# Patient Record
Sex: Female | Born: 1990 | Race: Black or African American | Hispanic: No | Marital: Single | Smoking: Never smoker
Health system: Southern US, Community
[De-identification: ages and names within clinical notes are randomized; demographics above are authoritative.]

## PROBLEM LIST (undated history)

## (undated) ENCOUNTER — Inpatient Hospital Stay (HOSPITAL_COMMUNITY): Payer: Self-pay

## (undated) DIAGNOSIS — O0930 Supervision of pregnancy with insufficient antenatal care, unspecified trimester: Secondary | ICD-10-CM

## (undated) DIAGNOSIS — R51 Headache: Secondary | ICD-10-CM

## (undated) HISTORY — PX: TONSILLECTOMY: SUR1361

## (undated) HISTORY — DX: Supervision of pregnancy with insufficient antenatal care, unspecified trimester: O09.30

---
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
## 2008-05-20 ENCOUNTER — Emergency Department (HOSPITAL_COMMUNITY): Admission: EM | Admit: 2008-05-20 | Discharge: 2008-05-20 | Payer: Self-pay | Admitting: Emergency Medicine

## 2010-12-12 NOTE — L&D Delivery Note (Signed)
Delivery Note At 10:21 AM a viable and healthy female was delivered via  ROA, with nuchal arm.  APGAR 9,9; weight 6 lb 12.8 oz (3084 g).   Placenta schultze intact, spontaneous 3 vessel cord. 2nd degree perineal laceration with repair 3-0 monocryl for good hemostasis. Epidural for labor and delivery. Pushed x 6 contractions. EBL 400 m. IV pitocin 20 u. Stable. Collaboration with Dr. Stefano Gaul. Plans outpatient circumcision. Bottle feeding. Delivery by Lavera Guise, CNM  Anesthesia:  epidural Episiotomy:  Lacerations: 2nd degree perineal Suture Repair: 3.0 monocryl Est. Blood Loss (mL):   Mom to postpartum.  Baby to skin to skin.  Nigel Bridgeman 11/25/2011, 10:53 AM

## 2011-03-08 ENCOUNTER — Other Ambulatory Visit: Payer: Self-pay | Admitting: Family Medicine

## 2011-03-08 ENCOUNTER — Other Ambulatory Visit (HOSPITAL_COMMUNITY)
Admission: RE | Admit: 2011-03-08 | Discharge: 2011-03-08 | Disposition: A | Discharge status: Home / Self Care | Payer: 59 | Source: Ambulatory Visit | Attending: Family Medicine | Admitting: Family Medicine

## 2011-03-08 DIAGNOSIS — Z113 Encounter for screening for infections with a predominantly sexual mode of transmission: Secondary | ICD-10-CM | POA: Insufficient documentation

## 2011-03-08 DIAGNOSIS — Z124 Encounter for screening for malignant neoplasm of cervix: Secondary | ICD-10-CM | POA: Insufficient documentation

## 2011-05-03 ENCOUNTER — Inpatient Hospital Stay (HOSPITAL_COMMUNITY)
Admission: AD | Admit: 2011-05-03 | Discharge: 2011-05-04 | Disposition: A | Discharge status: Home / Self Care | Payer: 59 | Source: Ambulatory Visit | Attending: Obstetrics & Gynecology | Admitting: Obstetrics & Gynecology

## 2011-05-03 DIAGNOSIS — O99891 Other specified diseases and conditions complicating pregnancy: Secondary | ICD-10-CM | POA: Insufficient documentation

## 2011-05-03 DIAGNOSIS — R51 Headache: Secondary | ICD-10-CM | POA: Insufficient documentation

## 2011-05-03 LAB — URINE MICROSCOPIC-ADD ON

## 2011-05-03 LAB — URINALYSIS, ROUTINE W REFLEX MICROSCOPIC
Bilirubin Urine: NEGATIVE
Glucose, UA: NEGATIVE mg/dL
Ketones, ur: 15 mg/dL — AB
Nitrite: NEGATIVE
Protein, ur: NEGATIVE mg/dL
Specific Gravity, Urine: 1.025 (ref 1.005–1.030)
Urobilinogen, UA: 0.2 mg/dL (ref 0.0–1.0)
pH: 5.5 (ref 5.0–8.0)

## 2011-05-03 LAB — POCT PREGNANCY, URINE: Preg Test, Ur: POSITIVE

## 2011-05-04 LAB — URINE CULTURE
Colony Count: NO GROWTH
Culture  Setup Time: 201205230152
Culture: NO GROWTH

## 2011-05-13 ENCOUNTER — Inpatient Hospital Stay (HOSPITAL_COMMUNITY): Payer: 59

## 2011-05-13 ENCOUNTER — Inpatient Hospital Stay (HOSPITAL_COMMUNITY)
Admission: AD | Admit: 2011-05-13 | Discharge: 2011-05-13 | Disposition: A | Discharge status: Home / Self Care | Payer: 59 | Source: Ambulatory Visit | Attending: Obstetrics and Gynecology | Admitting: Obstetrics and Gynecology

## 2011-05-13 DIAGNOSIS — O209 Hemorrhage in early pregnancy, unspecified: Secondary | ICD-10-CM

## 2011-05-13 LAB — WET PREP, GENITAL
Trich, Wet Prep: NONE SEEN
Yeast Wet Prep HPF POC: NONE SEEN

## 2011-05-13 LAB — URINALYSIS, ROUTINE W REFLEX MICROSCOPIC
Bilirubin Urine: NEGATIVE
Glucose, UA: NEGATIVE mg/dL
Ketones, ur: NEGATIVE mg/dL
Protein, ur: NEGATIVE mg/dL
pH: 6 (ref 5.0–8.0)

## 2011-05-13 LAB — URINE MICROSCOPIC-ADD ON

## 2011-05-14 LAB — GC/CHLAMYDIA PROBE AMP, GENITAL
Chlamydia, DNA Probe: NEGATIVE
GC Probe Amp, Genital: NEGATIVE

## 2011-06-09 LAB — ANTIBODY SCREEN: Antibody Screen: NEGATIVE

## 2011-06-09 LAB — GC/CHLAMYDIA PROBE AMP, GENITAL
Chlamydia: NEGATIVE
Gonorrhea: NEGATIVE

## 2011-06-30 ENCOUNTER — Other Ambulatory Visit (HOSPITAL_COMMUNITY): Payer: Self-pay | Admitting: Obstetrics and Gynecology

## 2011-06-30 DIAGNOSIS — O2690 Pregnancy related conditions, unspecified, unspecified trimester: Secondary | ICD-10-CM

## 2011-06-30 DIAGNOSIS — Z3689 Encounter for other specified antenatal screening: Secondary | ICD-10-CM

## 2011-07-12 ENCOUNTER — Encounter (HOSPITAL_COMMUNITY): Payer: Self-pay

## 2011-07-12 ENCOUNTER — Ambulatory Visit (HOSPITAL_COMMUNITY)
Admission: RE | Admit: 2011-07-12 | Discharge: 2011-07-12 | Disposition: A | Discharge status: Home / Self Care | Payer: 59 | Source: Ambulatory Visit | Attending: Obstetrics and Gynecology | Admitting: Obstetrics and Gynecology

## 2011-07-12 DIAGNOSIS — O2690 Pregnancy related conditions, unspecified, unspecified trimester: Secondary | ICD-10-CM

## 2011-07-12 DIAGNOSIS — Z1389 Encounter for screening for other disorder: Secondary | ICD-10-CM | POA: Insufficient documentation

## 2011-07-12 DIAGNOSIS — O358XX0 Maternal care for other (suspected) fetal abnormality and damage, not applicable or unspecified: Secondary | ICD-10-CM | POA: Insufficient documentation

## 2011-07-12 DIAGNOSIS — Z3689 Encounter for other specified antenatal screening: Secondary | ICD-10-CM

## 2011-07-12 DIAGNOSIS — Z363 Encounter for antenatal screening for malformations: Secondary | ICD-10-CM | POA: Insufficient documentation

## 2011-09-23 LAB — URINALYSIS, ROUTINE W REFLEX MICROSCOPIC
Bilirubin Urine: NEGATIVE
Ketones, ur: NEGATIVE
Nitrite: NEGATIVE
Protein, ur: NEGATIVE
Urobilinogen, UA: 0.2

## 2011-09-23 LAB — BASIC METABOLIC PANEL
CO2: 23
Calcium: 8.7
Chloride: 105
GFR calc Af Amer: >60
Potassium: 3.4 — ABNORMAL LOW
Sodium: 134 — ABNORMAL LOW

## 2011-09-23 LAB — PREGNANCY, URINE: Preg Test, Ur: NEGATIVE

## 2011-10-23 ENCOUNTER — Inpatient Hospital Stay (HOSPITAL_COMMUNITY)
Admission: AD | Admit: 2011-10-23 | Discharge: 2011-10-23 | Disposition: A | Discharge status: Home / Self Care | Payer: 59 | Source: Ambulatory Visit | Attending: Obstetrics and Gynecology | Admitting: Obstetrics and Gynecology

## 2011-10-23 ENCOUNTER — Encounter (HOSPITAL_COMMUNITY): Payer: Self-pay | Admitting: *Deleted

## 2011-10-23 DIAGNOSIS — O99891 Other specified diseases and conditions complicating pregnancy: Secondary | ICD-10-CM | POA: Insufficient documentation

## 2011-10-23 HISTORY — DX: Headache: R51

## 2011-10-23 LAB — WET PREP, GENITAL
Clue Cells Wet Prep HPF POC: NONE SEEN
Trich, Wet Prep: NONE SEEN

## 2011-10-23 LAB — URINALYSIS, ROUTINE W REFLEX MICROSCOPIC
Glucose, UA: NEGATIVE mg/dL
Specific Gravity, Urine: >1.03 — ABNORMAL HIGH (ref 1.005–1.030)
Urobilinogen, UA: 1 mg/dL (ref 0.0–1.0)
pH: 6 (ref 5.0–8.0)

## 2011-10-23 LAB — URINE MICROSCOPIC-ADD ON

## 2011-10-23 NOTE — Progress Notes (Signed)
Yesterday was on her feet for a long time,  Had bad sharp pain in lower abdominal area two times, today when went to grocery store, had vaginal clear sticky discharge.

## 2011-10-23 NOTE — ED Provider Notes (Signed)
History   20 yo G2P0010 at 63 6/7 weeks presented c/o "mucusy thin discharge".  Denies bleeding, reports occasional cramping, +FM.  Pregnancy remarkable for: Migraines 1st trimester vaginal bleeding Late to care    Chief Complaint  Patient presents with  . Abdominal Pain  . Rupture of Membranes     OB History    Grav Para Term Preterm Abortions TAB SAB Ect Mult Living   2 0 0 0 1 1 0 0 0 0       Past Medical History  Diagnosis Date  . Headache     History reviewed. No pertinent past surgical history.  Family History  Problem Relation Age of Onset  . Hypertension Maternal Aunt   . Cancer Maternal Grandmother   . Diabetes Paternal Grandmother     History  Substance Use Topics  . Smoking status: Current Everyday Smoker    Types: Cigarettes  . Smokeless tobacco: Not on file  . Alcohol Use: No     occasionally    Allergies: No Known Allergies  Prescriptions prior to admission  Medication Sig Dispense Refill  . prenatal vitamin w/FE, FA (PRENATAL 1 + 1) 27-1 MG TABS Take 1 tablet by mouth daily.        Marland Kitchen DISCONTD: PRENATAL VITAMINS PO Take by mouth.           Physical Exam   Blood pressure 118/87, pulse 104, temperature 98.5 F (36.9 C), temperature source Oral, resp. rate 16, height 5\' 2"  (1.575 m), weight 69.4 kg (153 lb), last menstrual period 02/14/2011.  Chest clear Heart RRR without murmur Abd gravid, NT Pelvic no pooling, but moderate creamy vaginal discharge.  Cervix closed, long, vtx -1. Amnisure done--negative. Ferning negative. FHR reactive, no decels UCs--occasional irritability, one defined UC.    ED Course  IUP at 35 6/7 weeks. No evidence SROM or labor GBS done.  D/C'd home with labor precautions. Keep scheduled appointment at [facility].  Nigel Bridgeman, CNM 10/23/11 1800

## 2011-10-23 NOTE — Progress Notes (Signed)
Pt c/o clear "snot" like discharge over the past 30 min while she was walking around the grocery. No bleeding or dysuria.

## 2011-10-27 LAB — CULTURE, BETA STREP (GROUP B ONLY): Organism ID, Bacteria: NEGATIVE

## 2011-10-27 LAB — STREP B DNA PROBE: GBS: NEGATIVE

## 2011-11-15 LAB — US OB DETAIL + 14 WK

## 2011-11-24 ENCOUNTER — Telehealth (HOSPITAL_COMMUNITY): Payer: Self-pay | Admitting: *Deleted

## 2011-11-24 ENCOUNTER — Encounter (HOSPITAL_COMMUNITY): Payer: Self-pay | Admitting: *Deleted

## 2011-11-24 NOTE — Telephone Encounter (Signed)
Preadmission screen  

## 2011-11-25 ENCOUNTER — Encounter (HOSPITAL_COMMUNITY): Payer: Self-pay

## 2011-11-25 ENCOUNTER — Encounter (HOSPITAL_COMMUNITY): Payer: Self-pay | Admitting: Anesthesiology

## 2011-11-25 ENCOUNTER — Inpatient Hospital Stay (HOSPITAL_COMMUNITY): Payer: 59 | Admitting: Anesthesiology

## 2011-11-25 ENCOUNTER — Inpatient Hospital Stay (HOSPITAL_COMMUNITY)
Admission: AD | Admit: 2011-11-25 | Discharge: 2011-11-27 | DRG: 775 | Disposition: A | Discharge status: Home / Self Care | Payer: 59 | Source: Ambulatory Visit | Attending: Obstetrics and Gynecology | Admitting: Obstetrics and Gynecology

## 2011-11-25 DIAGNOSIS — O0930 Supervision of pregnancy with insufficient antenatal care, unspecified trimester: Secondary | ICD-10-CM

## 2011-11-25 DIAGNOSIS — Z3490 Encounter for supervision of normal pregnancy, unspecified, unspecified trimester: Secondary | ICD-10-CM

## 2011-11-25 DIAGNOSIS — O480 Post-term pregnancy: Principal | ICD-10-CM | POA: Diagnosis present

## 2011-11-25 DIAGNOSIS — G43909 Migraine, unspecified, not intractable, without status migrainosus: Secondary | ICD-10-CM

## 2011-11-25 LAB — CBC
MCH: 31.2 pg (ref 26.0–34.0)
MCHC: 34.3 g/dL (ref 30.0–36.0)
Platelets: 244 10*3/uL (ref 150–400)
RBC: 4.39 MIL/uL (ref 3.87–5.11)
RDW: 12.8 % (ref 11.5–15.5)

## 2011-11-25 LAB — RPR: RPR Ser Ql: NONREACTIVE

## 2011-11-25 LAB — ABO/RH: RH Type: POSITIVE

## 2011-11-25 MED ORDER — BENZOCAINE-MENTHOL 20-0.5 % EX AERO
INHALATION_SPRAY | CUTANEOUS | Status: AC
  Administered 2011-11-25: 14:00:00
  Filled 2011-11-25: qty 56

## 2011-11-25 MED ORDER — OXYTOCIN BOLUS FROM INFUSION
500.0000 mL | Freq: Once | INTRAVENOUS | Status: DC
  Filled 2011-11-25: qty 1000
  Filled 2011-11-25: qty 500

## 2011-11-25 MED ORDER — CITRIC ACID-SODIUM CITRATE 334-500 MG/5ML PO SOLN: 30.0000 mL | ORAL | Status: DC | PRN

## 2011-11-25 MED ORDER — OXYTOCIN 20 UNITS IN LACTATED RINGERS INFUSION - SIMPLE: 1.0000 m[IU]/min | INTRAVENOUS | Status: DC

## 2011-11-25 MED ORDER — BENZOCAINE-MENTHOL 20-0.5 % EX AERO: 1.0000 "application " | INHALATION_SPRAY | CUTANEOUS | Status: DC | PRN

## 2011-11-25 MED ORDER — EPHEDRINE 5 MG/ML INJ
10.0000 mg | INTRAVENOUS | Status: DC | PRN
  Filled 2011-11-25: qty 4

## 2011-11-25 MED ORDER — OXYTOCIN 20 UNITS IN LACTATED RINGERS INFUSION - SIMPLE
125.0000 mL/h | Freq: Once | INTRAVENOUS | Status: AC
  Administered 2011-11-25: 999 mL/h via INTRAVENOUS

## 2011-11-25 MED ORDER — LACTATED RINGERS IV SOLN
INTRAVENOUS | Status: DC
  Administered 2011-11-25 (×2): via INTRAVENOUS

## 2011-11-25 MED ORDER — IBUPROFEN 600 MG PO TABS
600.0000 mg | ORAL_TABLET | Freq: Four times a day (QID) | ORAL | Status: DC | PRN
  Filled 2011-11-25: qty 1

## 2011-11-25 MED ORDER — OXYCODONE-ACETAMINOPHEN 5-325 MG PO TABS
1.0000 | ORAL_TABLET | ORAL | Status: DC | PRN
  Administered 2011-11-26: 1 via ORAL
  Filled 2011-11-25: qty 1

## 2011-11-25 MED ORDER — DIPHENHYDRAMINE HCL 25 MG PO CAPS: 25.0000 mg | ORAL_CAPSULE | Freq: Four times a day (QID) | ORAL | Status: DC | PRN

## 2011-11-25 MED ORDER — ONDANSETRON HCL 4 MG/2ML IJ SOLN: 4.0000 mg | INTRAMUSCULAR | Status: DC | PRN

## 2011-11-25 MED ORDER — WITCH HAZEL-GLYCERIN EX PADS: 1.0000 "application " | MEDICATED_PAD | CUTANEOUS | Status: DC | PRN

## 2011-11-25 MED ORDER — IBUPROFEN 600 MG PO TABS
600.0000 mg | ORAL_TABLET | Freq: Four times a day (QID) | ORAL | Status: DC
  Administered 2011-11-25 – 2011-11-27 (×9): 600 mg via ORAL
  Filled 2011-11-25 (×7): qty 1

## 2011-11-25 MED ORDER — FENTANYL 2.5 MCG/ML BUPIVACAINE 1/10 % EPIDURAL INFUSION (WH - ANES)
14.0000 mL/h | INTRAMUSCULAR | Status: DC
  Filled 2011-11-25: qty 60

## 2011-11-25 MED ORDER — CALCIUM CARBONATE ANTACID 500 MG PO CHEW
400.0000 mg | CHEWABLE_TABLET | Freq: Four times a day (QID) | ORAL | Status: DC | PRN
  Administered 2011-11-25: 400 mg via ORAL
  Filled 2011-11-25: qty 2

## 2011-11-25 MED ORDER — ONDANSETRON HCL 4 MG/2ML IJ SOLN
4.0000 mg | Freq: Four times a day (QID) | INTRAMUSCULAR | Status: DC | PRN
  Administered 2011-11-25: 4 mg via INTRAVENOUS
  Filled 2011-11-25: qty 2

## 2011-11-25 MED ORDER — PRENATAL PLUS 27-1 MG PO TABS
1.0000 | ORAL_TABLET | Freq: Every day | ORAL | Status: DC
  Administered 2011-11-25 – 2011-11-27 (×3): 1 via ORAL
  Filled 2011-11-25 (×3): qty 1

## 2011-11-25 MED ORDER — LIDOCAINE HCL 1.5 % IJ SOLN
INTRAMUSCULAR | Status: DC | PRN
  Administered 2011-11-25 (×2): 4 mL via EPIDURAL

## 2011-11-25 MED ORDER — SIMETHICONE 80 MG PO CHEW: 80.0000 mg | CHEWABLE_TABLET | ORAL | Status: DC | PRN

## 2011-11-25 MED ORDER — OXYCODONE-ACETAMINOPHEN 5-325 MG PO TABS: 2.0000 | ORAL_TABLET | ORAL | Status: DC | PRN

## 2011-11-25 MED ORDER — OXYTOCIN 10 UNIT/ML IJ SOLN: 10.0000 [IU] | Freq: Once | INTRAMUSCULAR | Status: DC

## 2011-11-25 MED ORDER — LANOLIN HYDROUS EX OINT: TOPICAL_OINTMENT | CUTANEOUS | Status: DC | PRN

## 2011-11-25 MED ORDER — ACETAMINOPHEN 325 MG PO TABS: 650.0000 mg | ORAL_TABLET | ORAL | Status: DC | PRN

## 2011-11-25 MED ORDER — PHENYLEPHRINE 40 MCG/ML (10ML) SYRINGE FOR IV PUSH (FOR BLOOD PRESSURE SUPPORT): 80.0000 ug | PREFILLED_SYRINGE | INTRAVENOUS | Status: DC | PRN

## 2011-11-25 MED ORDER — SENNOSIDES-DOCUSATE SODIUM 8.6-50 MG PO TABS
2.0000 | ORAL_TABLET | Freq: Every day | ORAL | Status: DC
  Administered 2011-11-25 – 2011-11-26 (×2): 2 via ORAL

## 2011-11-25 MED ORDER — LACTATED RINGERS IV SOLN: 500.0000 mL | Freq: Once | INTRAVENOUS | Status: DC

## 2011-11-25 MED ORDER — DIPHENHYDRAMINE HCL 50 MG/ML IJ SOLN: 12.5000 mg | INTRAMUSCULAR | Status: DC | PRN

## 2011-11-25 MED ORDER — EPHEDRINE 5 MG/ML INJ: 10.0000 mg | INTRAVENOUS | Status: DC | PRN

## 2011-11-25 MED ORDER — DIBUCAINE 1 % RE OINT: 1.0000 "application " | TOPICAL_OINTMENT | RECTAL | Status: DC | PRN

## 2011-11-25 MED ORDER — ZOLPIDEM TARTRATE 5 MG PO TABS: 5.0000 mg | ORAL_TABLET | Freq: Every evening | ORAL | Status: DC | PRN

## 2011-11-25 MED ORDER — LIDOCAINE HCL (PF) 1 % IJ SOLN: 30.0000 mL | INTRAMUSCULAR | Status: DC | PRN

## 2011-11-25 MED ORDER — BUTORPHANOL TARTRATE 2 MG/ML IJ SOLN
1.0000 mg | INTRAMUSCULAR | Status: DC | PRN
  Administered 2011-11-25 (×2): 1 mg via INTRAVENOUS
  Filled 2011-11-25 (×2): qty 1

## 2011-11-25 MED ORDER — LACTATED RINGERS IV SOLN
500.0000 mL | INTRAVENOUS | Status: DC | PRN
  Administered 2011-11-25: 1000 mL via INTRAVENOUS

## 2011-11-25 MED ORDER — FENTANYL 2.5 MCG/ML BUPIVACAINE 1/10 % EPIDURAL INFUSION (WH - ANES)
INTRAMUSCULAR | Status: DC | PRN
  Administered 2011-11-25: 14 mL/h via EPIDURAL

## 2011-11-25 MED ORDER — PHENYLEPHRINE 40 MCG/ML (10ML) SYRINGE FOR IV PUSH (FOR BLOOD PRESSURE SUPPORT)
80.0000 ug | PREFILLED_SYRINGE | INTRAVENOUS | Status: DC | PRN
  Filled 2011-11-25: qty 5

## 2011-11-25 MED ORDER — ONDANSETRON HCL 4 MG PO TABS: 4.0000 mg | ORAL_TABLET | ORAL | Status: DC | PRN

## 2011-11-25 MED ORDER — TERBUTALINE SULFATE 1 MG/ML IJ SOLN: 0.2500 mg | Freq: Once | INTRAMUSCULAR | Status: DC | PRN

## 2011-11-25 MED ORDER — TETANUS-DIPHTH-ACELL PERTUSSIS 5-2.5-18.5 LF-MCG/0.5 IM SUSP
0.5000 mL | Freq: Once | INTRAMUSCULAR | Status: AC
  Administered 2011-11-26: 0.5 mL via INTRAMUSCULAR

## 2011-11-25 MED ORDER — MEASLES, MUMPS & RUBELLA VAC ~~LOC~~ INJ
0.5000 mL | INJECTION | Freq: Once | SUBCUTANEOUS | Status: DC
  Filled 2011-11-25: qty 0.5

## 2011-11-25 MED ORDER — MAGNESIUM HYDROXIDE 400 MG/5ML PO SUSP: 30.0000 mL | ORAL | Status: DC | PRN

## 2011-11-25 NOTE — Progress Notes (Signed)
Pt states has been contracting all day but got worse at 2245

## 2011-11-25 NOTE — H&P (Signed)
Carolyn Harrington is a 20 y.o. female presenting for onset of ctx at about midnight, states they have gotten closer and stronger. Denies LOF, has some bloody mucous, +FM. Denies any other c/o. Cervix was 1/75/-1, changed to 2/100/0. Denies need for pain medicine now Pregnancy significant for: 1. Migraines - seen by [facility] and pain center, had trigger point injections 2. 1st trimester VB and BV 3. Abnormal [place] - placental shelf, benign per MFM, also benign adhesions noted.  4. Insufficient wgt gain, growth Korea normal  HPI: Began care at [facility] at 16wks, transferred from [facility]. Was seen for migraines at [facility] and pain center. Had a normal prenatal course, aside from abnormal finding on anat [place], MFM referral noted it was a benign placental shelf. Had growth Korea secondary to insufficient weight gain, and they were normal.  Maternal Medical History:  Reason for admission: Reason for admission: contractions.  Contractions: Onset was 3-5 hours ago.   Frequency: regular.   Perceived severity is moderate.    Fetal activity: Perceived fetal activity is normal.    Prenatal complications: no prenatal complications   OB History    Grav Para Term Preterm Abortions TAB SAB Ect Mult Living   2 0 0 0 1 1 0 0 0 0      Past Medical History  Diagnosis Date  . Headache   . Late prenatal care    Past Surgical History  Procedure Date  . No past surgeries    Family History: family history includes Asthma in her cousin; Cancer in her maternal grandmother; Diabetes in her maternal grandmother, paternal aunt, and paternal grandmother; Heart disease in her mother; Hypertension in her maternal aunt and paternal aunt; and Migraines in her father. Social History:  reports that she has never smoked. She has never used smokeless tobacco. She reports that she does not drink alcohol or use illicit drugs. Single AA, hs education  Review of Systems  Gastrointestinal: Positive for heartburn.  All other systems reviewed and are  negative.    Dilation: 2 Effacement (%): 100 Station: -2 Exam by:: S.Zamariah Seaborn,CNM Blood pressure 120/68, pulse 72, temperature 98.2 F (36.8 C), resp. rate 20, height 5\' 2"  (1.575 m), weight 69.854 kg (154 lb), last menstrual period 02/14/2011, SpO2 99.00%. Maternal Exam:  Uterine Assessment: Contraction strength is moderate.  Contraction frequency is regular.   Abdomen: Patient reports no abdominal tenderness. Fundal height is aga.   Estimated fetal weight is 7-11.   Fetal presentation: vertex  Introitus: Normal vulva. Vagina is positive for vaginal discharge.  Ferning test: not done.   Pelvis: adequate for delivery.   Cervix: Cervix evaluated by digital exam.     Fetal Exam Fetal Monitor Review: Mode: ultrasound.   Baseline rate: 140.  Variability: moderate (6-25 bpm).   Pattern: accelerations present and no decelerations.    Fetal State Assessment: Category I - tracings are normal.     Physical Exam  Nursing note and vitals reviewed. Constitutional: She is oriented to person, place, and time. She appears well-developed and well-nourished.  Neck: Normal range of motion.  Cardiovascular: Normal rate and normal heart sounds.   Respiratory: Effort normal and breath sounds normal.  GI: Soft.  Genitourinary: Vaginal discharge found.       Normal bloody show  Musculoskeletal: Normal range of motion. She exhibits no edema.  Neurological: She is alert and oriented to person, place, and time. She has normal reflexes.  Skin: Skin is warm and dry.  Psychiatric: She has a  normal mood and affect. Her behavior is normal. Judgment and thought content normal.    Prenatal labs: ABO, Rh: --/--/AB POS (06/01 1424) Antibody: Negative (06/28 0000) Rubella: Immune (06/28 0000) RPR: Nonreactive (06/28 0000)  HBsAg: Negative (06/28 0000)  HIV: Non-reactive (06/28 0000)  GBS:   neg hgb electro neg 1hr gtt normal Gc/ct neg   Assessment/Plan: Early labor  Postdates GBS neg FHR  reassuring  Admit to birthing suites - Dr Normand Sloop attending Routine MD orders Stadol/epidural PRN   [name] M 11/25/2011, 2:32 AM

## 2011-11-25 NOTE — Progress Notes (Signed)
Pt to be admitted-off monitor for ambulation in hall

## 2011-11-25 NOTE — Anesthesia Procedure Notes (Signed)
Epidural Patient location during procedure: OB Start time: 11/25/2011 8:07 AM  Staffing Anesthesiologist: Gene Glazebrook A. Performed by: anesthesiologist   Preanesthetic Checklist Completed: patient identified, site marked, surgical consent, pre-op evaluation, timeout performed, IV checked, risks and benefits discussed and monitors and equipment checked  Epidural Patient position: sitting Prep: site prepped and draped and DuraPrep Patient monitoring: continuous pulse ox and blood pressure Approach: midline Injection technique: LOR air  Needle:  Needle type: Tuohy  Needle gauge: 17 G Needle length: 9 cm Needle insertion depth: 5 cm cm Catheter type: closed end flexible Catheter size: 19 Gauge Catheter at skin depth: 10 cm Test dose: negative and 1.5% lidocaine  Assessment Events: blood not aspirated, injection not painful, no injection resistance, negative IV test and no paresthesia  Additional Notes Patient is more comfortable after epidural dosed. Please see RN's note for documentation of vital signs and FHR which are stable.

## 2011-11-25 NOTE — Anesthesia Preprocedure Evaluation (Signed)
Anesthesia Evaluation  Patient identified by MRN, date of birth, ID band Patient awake    Reviewed: Allergy & Precautions, H&P , Patient's Chart, lab work & pertinent test results  Airway Mallampati: II TM Distance: >3 FB Neck ROM: full    Dental No notable dental hx. (+) Teeth Intact   Pulmonary neg pulmonary ROS,  clear to auscultation  Pulmonary exam normal       Cardiovascular neg cardio ROS regular Normal    Neuro/Psych Negative Neurological ROS  Negative Psych ROS   GI/Hepatic negative GI ROS, Neg liver ROS,   Endo/Other  Negative Endocrine ROS  Renal/GU negative Renal ROS  Genitourinary negative   Musculoskeletal   Abdominal Normal abdominal exam  (+)   Peds  Hematology negative hematology ROS (+)   Anesthesia Other Findings   Reproductive/Obstetrics (+) Pregnancy                           Anesthesia Physical Anesthesia Plan  ASA: II  Anesthesia Plan: Epidural   Post-op Pain Management:    Induction:   Airway Management Planned:   Additional Equipment:   Intra-op Plan:   Post-operative Plan:   Informed Consent: I have reviewed the patients History and Physical, chart, labs and discussed the procedure including the risks, benefits and alternatives for the proposed anesthesia with the patient or authorized representative who has indicated his/her understanding and acceptance.     Plan Discussed with: Anesthesiologist  Anesthesia Plan Comments:         Anesthesia Quick Evaluation

## 2011-11-25 NOTE — Progress Notes (Signed)
UR chart review completed.  

## 2011-11-26 LAB — CBC
HCT: 35.1 % — ABNORMAL LOW (ref 36.0–46.0)
Hemoglobin: 11.8 g/dL — ABNORMAL LOW (ref 12.0–15.0)
MCH: 30.6 pg (ref 26.0–34.0)
MCHC: 33.6 g/dL (ref 30.0–36.0)
RBC: 3.85 MIL/uL — ABNORMAL LOW (ref 3.87–5.11)

## 2011-11-26 NOTE — Anesthesia Postprocedure Evaluation (Signed)
Anesthesia Post Note  Patient: Carolyn Harrington  Procedure(s) Performed: * No procedures listed *  Anesthesia type: Epidural  Patient location: Mother/Baby  Post pain: Pain level controlled  Post assessment: Post-op Vital signs reviewed  Last Vitals:  Filed Vitals:   11/26/11 0549  BP: 112/75  Pulse: 76  Temp: 36.8 C  Resp: 18    Post vital signs: Reviewed  Level of consciousness: awake  Complications: No apparent anesthesia complications

## 2011-11-26 NOTE — Progress Notes (Signed)
Post Partum Day 1 Subjective: Reports feeling well.  Ambulating, voiding and tol po liquids and solids without difficulty.  Pos flatus, neg BM.  Denies weakness or dizziness.  Reports mild perineal pain controlled with meds.  Formula feeding. Plans outpt circ.  Objective: Blood pressure 112/75, pulse 76, temperature 98.2 F (36.8 C), temperature source Oral, resp. rate 18, height 5\' 2"  (1.575 m), weight 69.854 kg (154 lb), last menstrual period 02/14/2011, SpO2 97.00%, unknown if currently breastfeeding. Filed Vitals:   11/25/11 1810 11/25/11 2211 11/26/11 0103 11/26/11 0549  BP: 110/69 106/64 110/71 112/75  Pulse: 76 73 74 76  Temp: 98.5 F (36.9 C) 98.4 F (36.9 C) 98 F (36.7 C) 98.2 F (36.8 C)  TempSrc: Oral Oral Oral Oral  Resp: 18 18 18 18   Height:      Weight:      SpO2: 97%      Physical Exam:  General: alert, cooperative and no distress Heart:  RRR Lungs:  CTA bilat Breasts:  Soft Abd:  Soft, NT with pos BS x 4 quads Lochia: appropriate, sm rubra Uterine Fundus: firm, NT 1 below umbilicus Incision: Perineum intact DVT Evaluation: No evidence of DVT seen on physical exam. Negative Homan's sign. No significant calf/ankle edema.   Basename 11/26/11 0525 11/25/11 0345  HGB 11.8* 13.7  HCT 35.1* 39.9    Assessment/Plan: Stable s/p vag del at 40w 4d  Plan for discharge tomorrow. Continue current care.   LOS: 1 day   Eniya Cannady O. 11/26/2011, 11:28 AM

## 2011-11-27 ENCOUNTER — Encounter: Payer: Self-pay | Admitting: Obstetrics and Gynecology

## 2011-11-27 MED ORDER — IBUPROFEN 600 MG PO TABS: 600.0000 mg | ORAL_TABLET | Freq: Four times a day (QID) | ORAL | Status: AC

## 2011-11-27 MED ORDER — NORETHINDRONE ACET-ETHINYL EST 1-20 MG-MCG PO TABS: 1.0000 | ORAL_TABLET | Freq: Every day | ORAL | Status: DC

## 2011-11-27 NOTE — Progress Notes (Signed)
Patient ID: Miquela D Vila, female   DOB: 12-14-1990, 20 y.o.   MRN: [id number] Post Partum Day 2 Subjective: no complaints, up ad lib without syncope, voiding, tolerating PO, + flatus  Pain well controlled with po meds Bottle feeding Mood stable, bonding well   Objective: Blood pressure 122/81, pulse 66, temperature 98.4 F (36.9 C), temperature source Oral, resp. rate 15, height 5\' 2"  (1.575 m), weight 69.854 kg (154 lb), last menstrual period 02/14/2011, SpO2 91.00%, unknown if currently breastfeeding.  Physical Exam:  General: alert and no distress Lungs: CTAB Heart: RRR Breasts: soft/filling,  Lochia: appropriate Uterine Fundus: firm Perineum: WNL DVT Evaluation: No evidence of DVT seen on physical exam. Negative Homan's sign. No significant calf/ankle edema.   Basename 11/26/11 0525 11/25/11 0345  HGB 11.8* 13.7  HCT 35.1* 39.9    Assessment/Plan: Discharge home and Contraception LoEstrin Outpatient circumcision  Stable PP      LOS: 2 days   Teela Narducci M 11/27/2011, 11:23 AM

## 2011-11-27 NOTE — Discharge Summary (Signed)
   Obstetric Discharge Summary Reason for Admission: onset of labor Prenatal Procedures: ultrasound Intrapartum Procedures: spontaneous vaginal delivery Postpartum Procedures: none Complications-Operative and Postpartum: none  Temp:  [98.1 F (36.7 C)-98.4 F (36.9 C)] 98.4 F (36.9 C) (12/16 0524) Pulse Rate:  [66-88] 66  (12/16 0524) Resp:  [15-18] 15  (12/16 0524) BP: (91-122)/(51-81) 122/81 mmHg (12/16 0524) SpO2:  [91 %-98 %] 91 % (12/16 0524) Hemoglobin  Date Value Range Status  11/26/2011 11.8* 12.0-15.0 (g/dL) Final     HCT  Date Value Range Status  11/26/2011 35.1* 36.0-46.0 (%) Final    Hospital Course:  Hospital Course: Admitted in labor. . neg GBS. Progressed to fully dilated. Delivery was performed by V.Emilee Hero, CNM without difficulty. Patient and baby tolerated the procedure without difficulty, with a 1st laceration noted. Infant to FTN. Mother and infant then had an uncomplicated postpartum course, with bottle feeding going well. Mom's physical exam was WNL, and she was discharged home in stable condition. Contraception plan was LoEstrin.  She received adequate benefit from po pain medications.  Discharge Diagnoses: Term Pregnancy-delivered  Discharge Information: Date: 11/27/2011 Activity: pelvic rest Diet: routine Medications:  Medication List  As of 11/27/2011 11:25 AM   START taking these medications         ibuprofen 600 MG tablet   Commonly known as: ADVIL,MOTRIN   Take 1 tablet (600 mg total) by mouth every 6 (six) hours.      norethindrone-ethinyl estradiol 1-20 MG-MCG tablet   Commonly known as: MICROGESTIN,JUNEL,LOESTRIN   Take 1 tablet by mouth daily.   Start taking on: 12/17/2011         CONTINUE taking these medications         prenatal vitamin w/FE, FA 27-1 MG Tabs         STOP taking these medications         nystatin-triamcinolone cream          Where to get your medications    These are the prescriptions that you need to pick  up.   You may get these medications from any pharmacy.         ibuprofen 600 MG tablet   norethindrone-ethinyl estradiol 1-20 MG-MCG tablet           Condition: stable Instructions: refer to practice specific booklet Discharge to: home   Newborn Data: Live born  Information for the patient's newborn:  Oberia, Beaudoin Anina [[id number]]  female ; APGAR 9, 9 ; weight ; 6#13oz Home with mother.  Durrell Barajas M 11/27/2011, 11:25 AM

## 2011-11-29 ENCOUNTER — Inpatient Hospital Stay (HOSPITAL_COMMUNITY): Admission: RE | Admit: 2011-11-29 | Payer: 59 | Source: Ambulatory Visit

## 2011-11-29 ENCOUNTER — Other Ambulatory Visit: Payer: Self-pay | Admitting: Obstetrics and Gynecology

## 2011-12-07 ENCOUNTER — Encounter (HOSPITAL_COMMUNITY): Payer: Self-pay | Admitting: *Deleted

## 2012-04-05 ENCOUNTER — Encounter: Payer: Self-pay | Admitting: Obstetrics and Gynecology

## 2012-04-05 ENCOUNTER — Ambulatory Visit (INDEPENDENT_AMBULATORY_CARE_PROVIDER_SITE_OTHER): Payer: 59 | Admitting: Obstetrics and Gynecology

## 2012-04-05 VITALS — BP 108/70 | Temp 98.5°F | Resp 16 | Ht 62.0 in | Wt 151.0 lb

## 2012-04-05 DIAGNOSIS — Z309 Encounter for contraceptive management, unspecified: Secondary | ICD-10-CM

## 2012-04-05 DIAGNOSIS — N8189 Other female genital prolapse: Secondary | ICD-10-CM

## 2012-04-05 DIAGNOSIS — Z01419 Encounter for gynecological examination (general) (routine) without abnormal findings: Secondary | ICD-10-CM

## 2012-04-05 DIAGNOSIS — E663 Overweight: Secondary | ICD-10-CM

## 2012-04-05 DIAGNOSIS — K5900 Constipation, unspecified: Secondary | ICD-10-CM | POA: Insufficient documentation

## 2012-04-05 MED ORDER — NORETHINDRONE ACET-ETHINYL EST 1-20 MG-MCG PO TABS: 1.0000 | ORAL_TABLET | Freq: Every day | ORAL | Status: DC

## 2012-04-05 NOTE — Patient Instructions (Signed)

## 2012-04-05 NOTE — Progress Notes (Signed)
Allergies: NKDA LMP: 03/08/2012 Last Pap: 02/2011-Normal Contraception: Loestrin Gravida: 2 Para: 1 Primary Doctor: Dr. Sarajane Marek EXAM  Regular Periods yes   Monthly Breast Ex. yes Tetanus<10 years ? NI. Bladder Function yes Daily BM's no Healthy Diet Sometimes Calcium/Multi-vitamin yes Mammogram no Date: Smoker no How much? Alcohol Abuse no How much? Substance Abuse no How much? Exercise yes How much? 3-5 x's weekly Seatbelts yes Abuse at Home no Stressful Work no Sigmoid/Colonoscopy in past 5 years no Medications:   Medical problems this year: None  PMH: None  FMH: None  ABDOMINAL/PELVIC PAIN no  VAGINAL DISCHARGE no  IRREGULAR BLEEDING no  MENOPAUSAL SYMPTOMS no  Subjective:    Carolyn Harrington is a 21 y.o. female, [phone], who presents for an annual exam. See above.  Prior Hysterectomy: No    History   Social History  . Marital Status: Single    Spouse Name: N/A    Number of Children: N/A  . Years of Education: N/A   Social History Main Topics  . Smoking status: Never Smoker   . Smokeless tobacco: Never Used  . Alcohol Use: No     occasionally  . Drug Use: No  . Sexually Active: Yes   Other Topics Concern  . Not on file   Social History Narrative  . No narrative on file    Menstrual cycle:   LMP: Patient's last menstrual period was 03/08/2012.           Cycle: flow is light and Regular, monthly with normal flow and no severe dysmenorrha  The following portions of the patient's history were reviewed and updated as appropriate: allergies, current medications, past family history, past medical history, past social history, past surgical history and problem list.  Review of Systems Pertinent items are noted in HPI. Breast:Negative for breast lump,nipple discharge or nipple retraction Gastrointestinal: Negative for abdominal pain, change in bowel habits or rectal bleeding Urinary:negative   Objective:    BP 108/70  Temp 98.5 F  (36.9 C)  Resp 16  Ht 5\' 2"  (1.575 m)  Wt 151 lb (68.493 kg)  BMI 27.62 kg/m2  LMP 03/08/2012  Breastfeeding? No    Weight:  Wt Readings from Last 1 Encounters:  04/05/12 151 lb (68.493 kg)          BMI: Body mass index is 27.62 kg/(m^2).  General Appearance: Alert, appropriate appearance for age. No acute distress HEENT: Grossly normal Neck / Thyroid: Supple, no masses, nodes or enlargement Lungs: clear to auscultation bilaterally Back: No CVA tenderness Breast Exam: No masses or nodes.No dimpling, nipple retraction or discharge. Cardiovascular: Regular rate and rhythm. S1, S2, no murmur Gastrointestinal: Soft, non-tender, no masses or organomegaly  ++++++++++++++++++++++++++++++++++++++++++++++++++++++++  Pelvic Exam: External genitalia: normal general appearance Vaginal: normal without tenderness, induration or masses Cervix: normal appearance Adnexa: normal bimanual exam Uterus: normal single, nontender a small cystocele present Rectovaginal: not indicated  ++++++++++++++++++++++++++++++++++++++++++++++++++++++++  Lymphatic Exam: Non-palpable nodes in neck, clavicular, axillary, or inguinal regions Neurologic: Normal speech, no tremor  Psychiatric: Alert and oriented, appropriate affect.   Wet Prep:not applicable Urinalysis:not applicable UPT: Not done   Assessment:    Normal gyn exam contraception  Pelvic relaxation Constipation  Overweight or obese: Yes   Pelvic relaxation: Yes   Plan:    pap smear return annually or prn Contraception:Loestrin 28 day    STD screen request: none RPR: No.  HBsAg: No. Hepatitis C: No.  The updated Pap smear screening guidelines were discussed with  the patient. The patient requested that I obtain a Pap smear: Yes.  Kegel exercises discussed: Yes.  Proper diet and regular exercise were reviewed. A high fiber diet is recommended.  MiraLax when necessary.  Annual mammograms recommended starting at age 19. Proper  breast care was discussed.  Screening colonoscopy is recommended beginning at age 93.  Regular health maintenance was reviewed.  Sleep hygiene was discussed.  Adequate calcium and vitamin D intake was emphasized.  Mylinda Latina.D.

## 2012-05-22 ENCOUNTER — Other Ambulatory Visit: Payer: Self-pay | Admitting: Obstetrics and Gynecology

## 2012-05-22 MED ORDER — NORETHINDRONE ACET-ETHINYL EST 1-20 MG-MCG PO TABS: 1.0000 | ORAL_TABLET | Freq: Every day | ORAL | Status: DC

## 2012-05-22 NOTE — Telephone Encounter (Signed)
Refill for pt bc pill resent to [facility] on [address]. Pt notified and voiced understanding.

## 2012-05-22 NOTE — Telephone Encounter (Signed)
Triage/epic 

## 2012-08-16 ENCOUNTER — Telehealth: Payer: Self-pay | Admitting: Obstetrics and Gynecology

## 2012-08-17 ENCOUNTER — Encounter: Payer: Self-pay | Admitting: Obstetrics and Gynecology

## 2012-08-17 ENCOUNTER — Ambulatory Visit (INDEPENDENT_AMBULATORY_CARE_PROVIDER_SITE_OTHER): Payer: 59 | Admitting: Obstetrics and Gynecology

## 2012-08-17 VITALS — BP 110/72 | Temp 99.1°F | Wt 155.0 lb

## 2012-08-17 DIAGNOSIS — Z113 Encounter for screening for infections with a predominantly sexual mode of transmission: Secondary | ICD-10-CM

## 2012-08-17 DIAGNOSIS — N390 Urinary tract infection, site not specified: Secondary | ICD-10-CM

## 2012-08-17 DIAGNOSIS — N949 Unspecified condition associated with female genital organs and menstrual cycle: Secondary | ICD-10-CM

## 2012-08-17 DIAGNOSIS — N912 Amenorrhea, unspecified: Secondary | ICD-10-CM

## 2012-08-17 DIAGNOSIS — A499 Bacterial infection, unspecified: Secondary | ICD-10-CM

## 2012-08-17 DIAGNOSIS — R102 Pelvic and perineal pain: Secondary | ICD-10-CM

## 2012-08-17 DIAGNOSIS — Z331 Pregnant state, incidental: Secondary | ICD-10-CM

## 2012-08-17 DIAGNOSIS — N898 Other specified noninflammatory disorders of vagina: Secondary | ICD-10-CM

## 2012-08-17 DIAGNOSIS — B9689 Other specified bacterial agents as the cause of diseases classified elsewhere: Secondary | ICD-10-CM

## 2012-08-17 DIAGNOSIS — Z3490 Encounter for supervision of normal pregnancy, unspecified, unspecified trimester: Secondary | ICD-10-CM

## 2012-08-17 DIAGNOSIS — N760 Acute vaginitis: Secondary | ICD-10-CM

## 2012-08-17 LAB — POCT URINALYSIS DIPSTICK
Blood, UA: NEGATIVE
Nitrite, UA: NEGATIVE
Protein, UA: NEGATIVE
Urobilinogen, UA: NEGATIVE
pH, UA: 5

## 2012-08-17 MED ORDER — CLINDAMYCIN HCL 300 MG PO CAPS: 300.0000 mg | ORAL_CAPSULE | Freq: Three times a day (TID) | ORAL | Status: AC

## 2012-08-17 NOTE — Progress Notes (Signed)
Contraception: none History of STD:  no history of PID, STD's History of ovarian cyst: no History of fibroids: no History of endometriosis:no Previous ultrasound:no  Urinary symptoms: urinary frequency Gastro-intestinal symptoms:  Constipation: yes     Diarrhea: no     Nausea: no     Vomiting: yes      Fever: no Vaginal discharge: odor NOT A PLEASANT SMELL

## 2012-08-17 NOTE — Progress Notes (Addendum)
21 YO with amenorrhea since 07/09/12 complains of malodorous vaginal discharge and pelvic discomfort-feels like her period is going to start.  Is S/P SVB 11/25/11.  Denies urinary tract symptoms, changes in bowel function, or bleeding.    O: Abdomen: soft, with diffuse tenderness without guarding in both lower quadrants      Pelvic: EGBUS-wnl, vagina-white discharge, cervix-no lesions, long/closed, uterus-normal size and tender,      adnexae-no masses or tenderness  UPT-positive U/A- 1.020,  ph-5.0,  leukocytes- 2+ Wet Prep: ph-5.5, whiff-positive,  many clue cells  A: Pregnant (5w 4d by LMP)     Pelvic Pain     Bacterial Vaginosis     Blood Type AB +  P: Ectopic precautions      Cleocin 300mg  #14 bid x 7 day      GC/CT, QHCG-pending;  urine sent for culture (OB)      If QHCG >2500 will proceed with ultrasound if < 2500 will repeat the [facility] on 08/20/12      Voicemail message left with  weekend on-call mid-wife Elsie Ra about this patient's [facility] that was not done stat      Also notified Paulino Door, midwife on call today just in case patient appeared at the hospital with pain.       Patient states she is not planning o continue this pregnancy however, i stressed to her the need to rule out an ectopic        RTO-TBA  Jenel Gierke, PA-C

## 2012-08-18 LAB — GC/CHLAMYDIA PROBE AMP, GENITAL
Chlamydia, DNA Probe: NEGATIVE
GC Probe Amp, Genital: NEGATIVE

## 2012-08-20 ENCOUNTER — Telehealth: Payer: Self-pay | Admitting: Obstetrics and Gynecology

## 2012-08-20 NOTE — Telephone Encounter (Signed)
Call to patient seen 08/17/12 with pelvic pain and QHCG = 25.  Was to have her labs repeated on Sunday however, no results are in the system.  Called patient to follow up on pain and to make sure that a repeat [facility]  is done. Left message for patient to call back.  Sheela Mcculley,ELMIRAm PA-C

## 2012-08-22 NOTE — Telephone Encounter (Signed)
Left message for patient asking her to return call.  She was seen last Friday for pelvic pain and with a QHCG of 25.  She needs a repeat QHCGs  48 hours apart to help with ruling out an ectopic.  Left a message for patient to return call.  Riku Buttery,PA-C

## 2013-01-14 IMAGING — US US OB COMP LESS 14 WK
1 series · 14 of 25 positions shown · non-contrast
Comparison: none

[Series 1: us ob comp less 14 wks · 25 acquisitions, 14 frames shown]
[im 1/25]
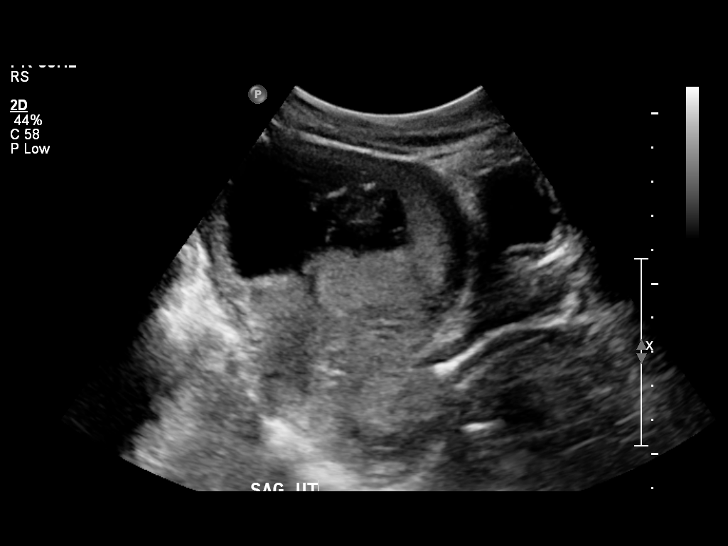
[im 3/25]
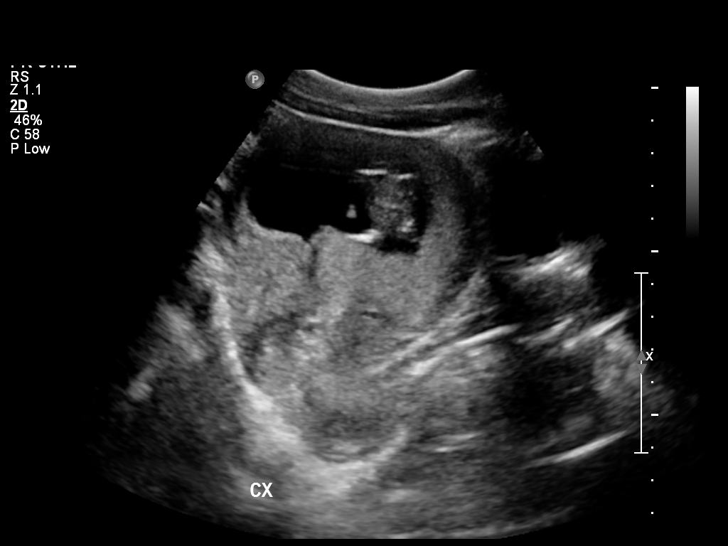
[im 5/25]
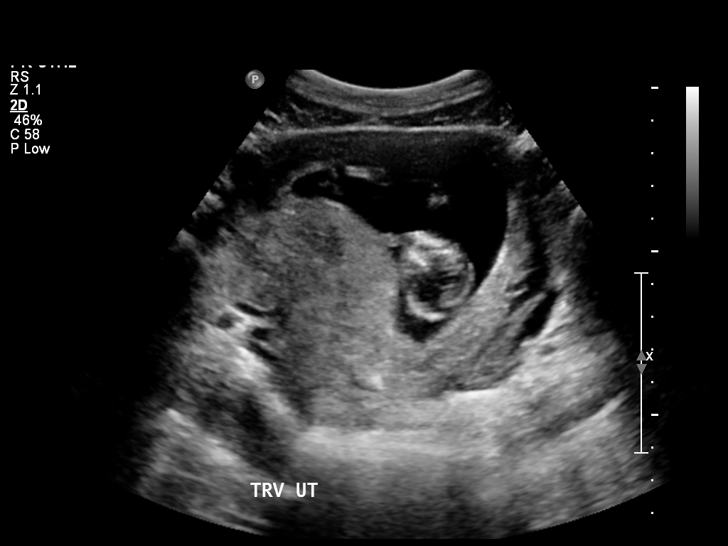
[im 7/25]
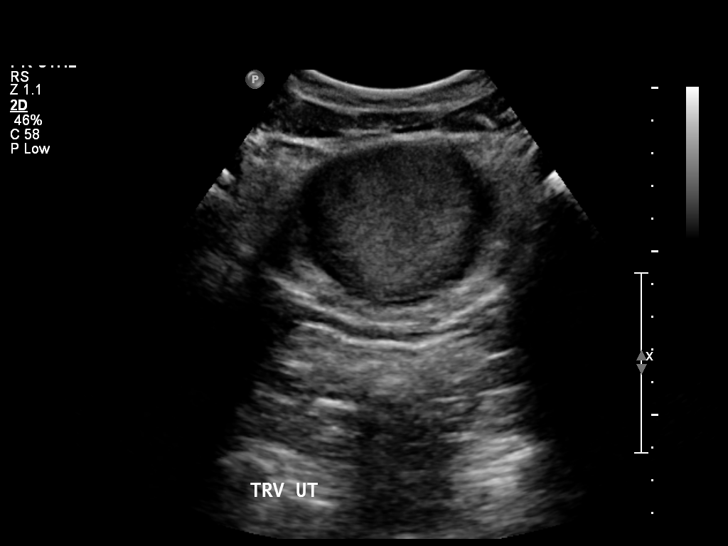
[im 9/25]
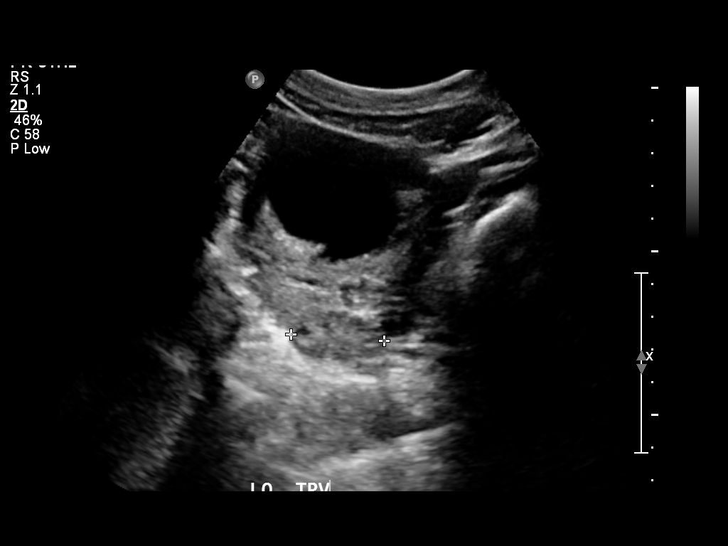
[im 10/25]
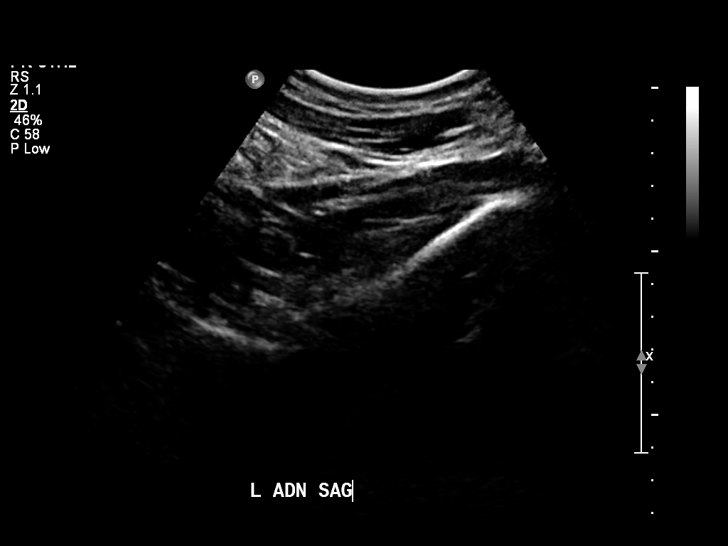
[im 12/25]
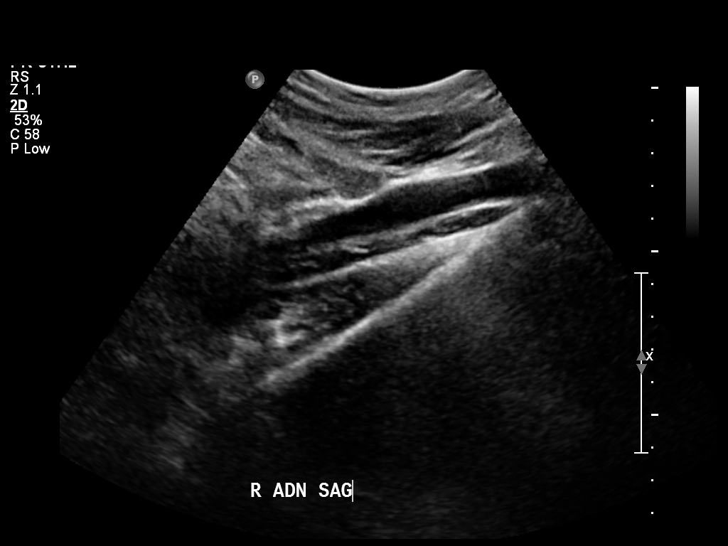
[im 14/25]
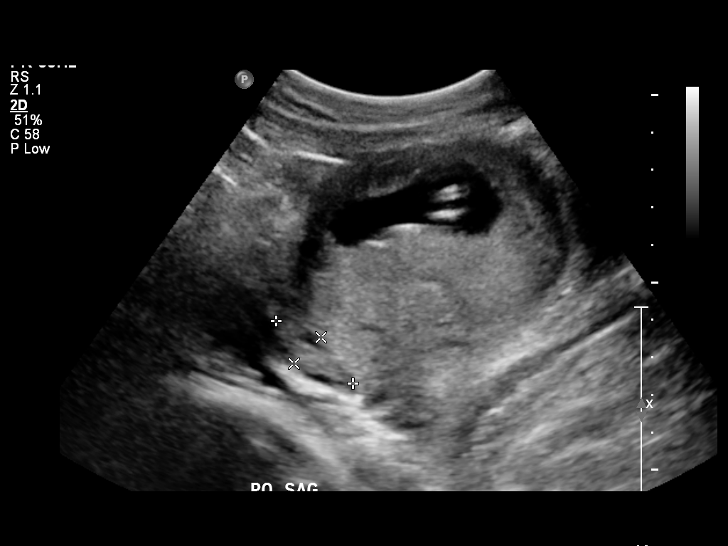
[im 16/25]
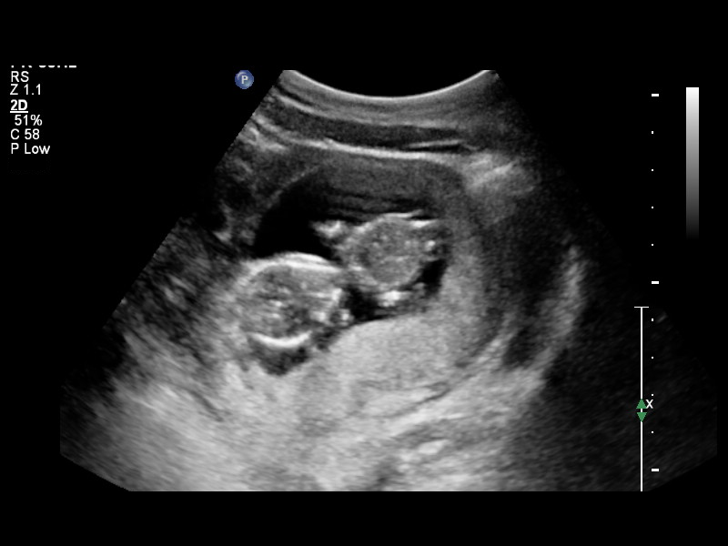
[im 17/25]
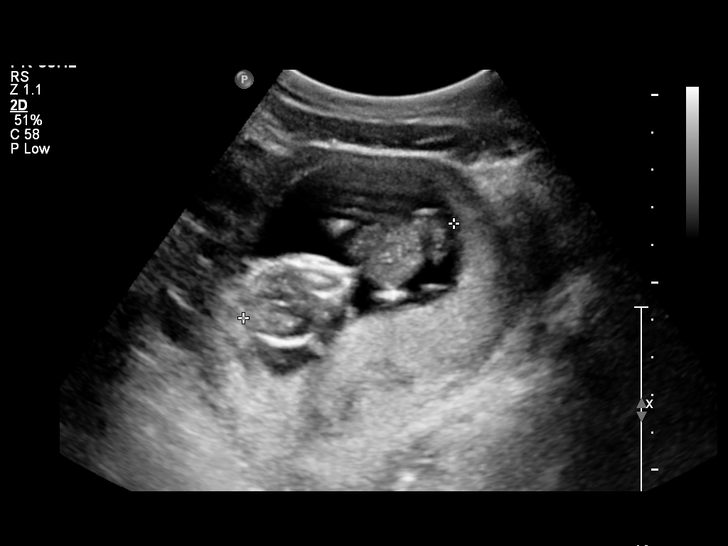
[im 19/25]
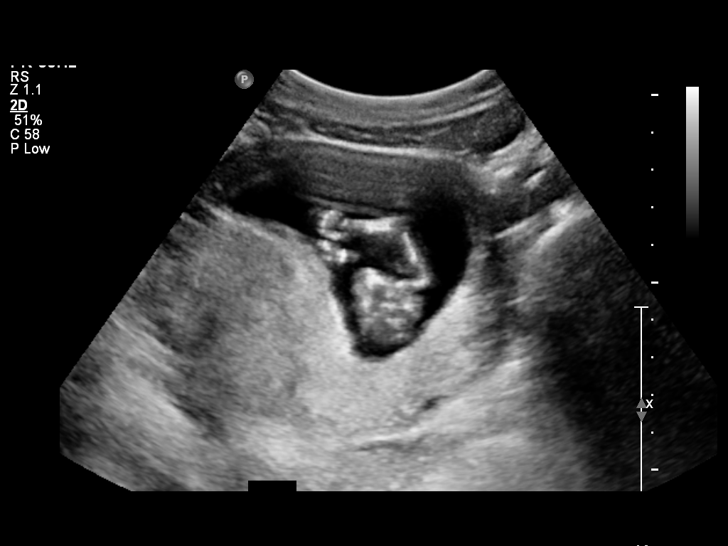
[im 21/25]
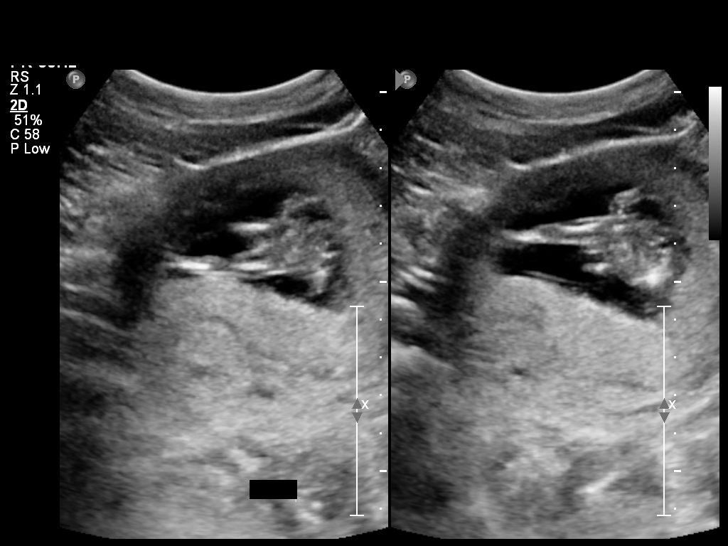
[im 23/25]
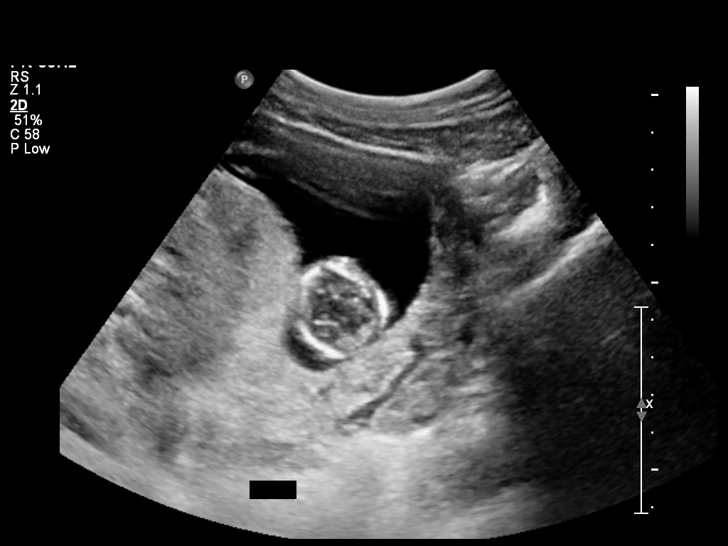
[im 25/25]
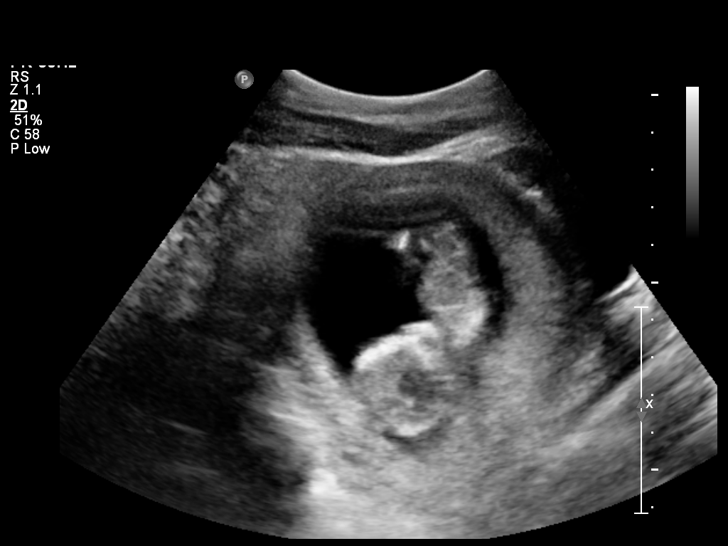

[14 of 25 positions shown; findings below may reference images not displayed]

OBSTETRICS REPORT
                      (Signed Final 05/13/2011 [DATE])

Procedures

 US OB COMP LESS 14 WKS                                76801.0
Indications

 Vaginal bleeding, unknown etiology
Fetal Evaluation

 Gest. Sac:         Intrauterine
 Yolk Sac:          Not visualized
 Fetal Pole:        Visualized
 Fetal Heart Rate:  161                          bpm
 Cardiac Activity:  Observed

 Comment:    No subchorionic hemorrhage seen.
Biometry

 CRL:     59.7  mm     G. Age:  12w 2d                 EDD:    11/23/11
Gestational Age

 LMP:           12w 4d        Date:  02/14/11                 EDD:   11/21/11
 Best:          12w 4d     Det. By:  LMP  (02/14/11)          EDD:   11/21/11
Cervix Uterus Adnexa

 Cervix:       Closed.
 Left Ovary:    Within normal limits.
 Right Ovary:   Within normal limits.

 Adnexa:     No abnormality visualized.
Impression

 Single living IUP.  US EGA is concordant with LMP.
 No significant maternal uterine or adnexal abnormality
 identified.

 questions or concerns.

## 2013-02-18 ENCOUNTER — Other Ambulatory Visit: Payer: Self-pay | Admitting: Otolaryngology

## 2013-11-26 ENCOUNTER — Other Ambulatory Visit: Payer: Self-pay | Admitting: Family Medicine

## 2013-11-26 DIAGNOSIS — N6452 Nipple discharge: Secondary | ICD-10-CM

## 2013-11-27 ENCOUNTER — Inpatient Hospital Stay: Admission: RE | Admit: 2013-11-27 | Payer: 59 | Source: Ambulatory Visit

## 2013-11-28 ENCOUNTER — Ambulatory Visit
Admission: RE | Admit: 2013-11-28 | Discharge: 2013-11-28 | Disposition: A | Discharge status: Home / Self Care | Payer: Medicaid Other | Source: Ambulatory Visit | Attending: Family Medicine | Admitting: Family Medicine

## 2013-11-28 DIAGNOSIS — N6452 Nipple discharge: Secondary | ICD-10-CM

## 2014-03-01 ENCOUNTER — Encounter (HOSPITAL_COMMUNITY): Payer: Self-pay | Admitting: Emergency Medicine

## 2014-03-01 ENCOUNTER — Emergency Department (HOSPITAL_COMMUNITY)
Admission: EM | Admit: 2014-03-01 | Discharge: 2014-03-01 | Disposition: A | Discharge status: Home / Self Care | Payer: Medicaid Other | Attending: Emergency Medicine | Admitting: Emergency Medicine

## 2014-03-01 ENCOUNTER — Emergency Department (HOSPITAL_COMMUNITY): Payer: Medicaid Other

## 2014-03-01 DIAGNOSIS — S62639A Displaced fracture of distal phalanx of unspecified finger, initial encounter for closed fracture: Secondary | ICD-10-CM | POA: Insufficient documentation

## 2014-03-01 DIAGNOSIS — S6000XA Contusion of unspecified finger without damage to nail, initial encounter: Secondary | ICD-10-CM | POA: Insufficient documentation

## 2014-03-01 MED ORDER — IBUPROFEN 600 MG PO TABS: 600.0000 mg | ORAL_TABLET | Freq: Four times a day (QID) | ORAL | Status: DC | PRN

## 2014-03-01 NOTE — ED Notes (Signed)
Pt states she dislocated her pinkie finger on her R hand on Wednesday. Pt states she "popped it back into place." Pt reports that finger is now bruised and "still crooked." Pt wants finger checked out.

## 2014-03-01 NOTE — ED Notes (Signed)
Pt not in room to receive d/c instructions.  

## 2014-03-01 NOTE — Discharge Instructions (Signed)
Wear a finger splint for stability and comfort. Recommend ice to limit swelling. Take ibuprofen for pain control. Follow up with a hand specialist to ensure proper healing and lack of tendon injury.  Finger Fracture Fractures of fingers are breaks in the bones of the fingers. There are many types of fractures. There are different ways of treating these fractures. Your health care provider will discuss the best way to treat your fracture. CAUSES Traumatic injury is the main cause of broken fingers. These include:  Injuries while playing sports.  Workplace injuries.  Falls. RISK FACTORS Activities that can increase your risk of finger fractures include:  Sports.  Workplace activities that involve machinery.  A condition called osteoporosis, which can make your bones less dense and cause them to fracture more easily. SIGNS AND SYMPTOMS The main symptoms of a broken finger are pain and swelling within 15 minutes after the injury. Other symptoms include:  Bruising of your finger.  Stiffness of your finger.  Numbness of your finger.  Exposed bones (compound fracture) if the fracture is severe. DIAGNOSIS  The best way to diagnose a broken bone is with X-ray imaging. Additionally, your health care provider will use this X-ray image to evaluate the position of the broken finger bones.  TREATMENT  Finger fractures can be treated with:   Nonreduction This means the bones are in place. The finger is splinted without changing the positions of the bone pieces. The splint is usually left on for about a week to 10 days. This will depend on your fracture and what your health care provider thinks.  Closed reduction The bones are put back into position without using surgery. The finger is then splinted.  Open reduction and internal fixation The fracture site is opened. Then the bone pieces are fixed into place with pins or some type of hardware. This is seldom required. It depends on the severity  of the fracture. HOME CARE INSTRUCTIONS   Follow your health care provider's instructions regarding activities, exercises, and physical therapy.  Only take over-the-counter or prescription medicines for pain, discomfort, or fever as directed by your health care provider. SEEK MEDICAL CARE IF: You have pain or swelling that limits the motion or use of your fingers. SEEK IMMEDIATE MEDICAL CARE IF:  Your finger becomes numb. MAKE SURE YOU:   Understand these instructions.  Will watch your condition.  Will get help right away if you are not doing well or get worse. Document Released: 03/12/2001 Document Revised: 09/18/2013 Document Reviewed: 07/10/2013 [facility] Patient Information 2014 [place], [place].
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
## 2014-03-02 NOTE — ED Provider Notes (Signed)
Medical screening examination/treatment/procedure(s) were performed by non-physician practitioner and as supervising physician I was immediately available for consultation/collaboration.    Megan E Docherty, MD 03/02/14 1140 

## 2014-04-02 ENCOUNTER — Other Ambulatory Visit (HOSPITAL_COMMUNITY)
Admission: RE | Admit: 2014-04-02 | Discharge: 2014-04-02 | Disposition: A | Discharge status: Home / Self Care | Payer: Medicaid Other | Source: Ambulatory Visit | Attending: Family Medicine | Admitting: Family Medicine

## 2014-04-02 ENCOUNTER — Other Ambulatory Visit: Payer: Self-pay | Admitting: Family Medicine

## 2014-04-02 DIAGNOSIS — Z124 Encounter for screening for malignant neoplasm of cervix: Secondary | ICD-10-CM | POA: Insufficient documentation

## 2014-04-02 DIAGNOSIS — N760 Acute vaginitis: Secondary | ICD-10-CM | POA: Insufficient documentation

## 2014-10-13 ENCOUNTER — Encounter (HOSPITAL_COMMUNITY): Payer: Self-pay | Admitting: Emergency Medicine

## 2015-11-03 IMAGING — CR DG FINGER LITTLE 2+V*R*
3 series · 3 of 3 positions shown · non-contrast
Comparison: None.

CLINICAL DATA: Injury to right small finger, distal pain.

EXAM:
RIGHT LITTLE FINGER 2+V

[x finger pa right]
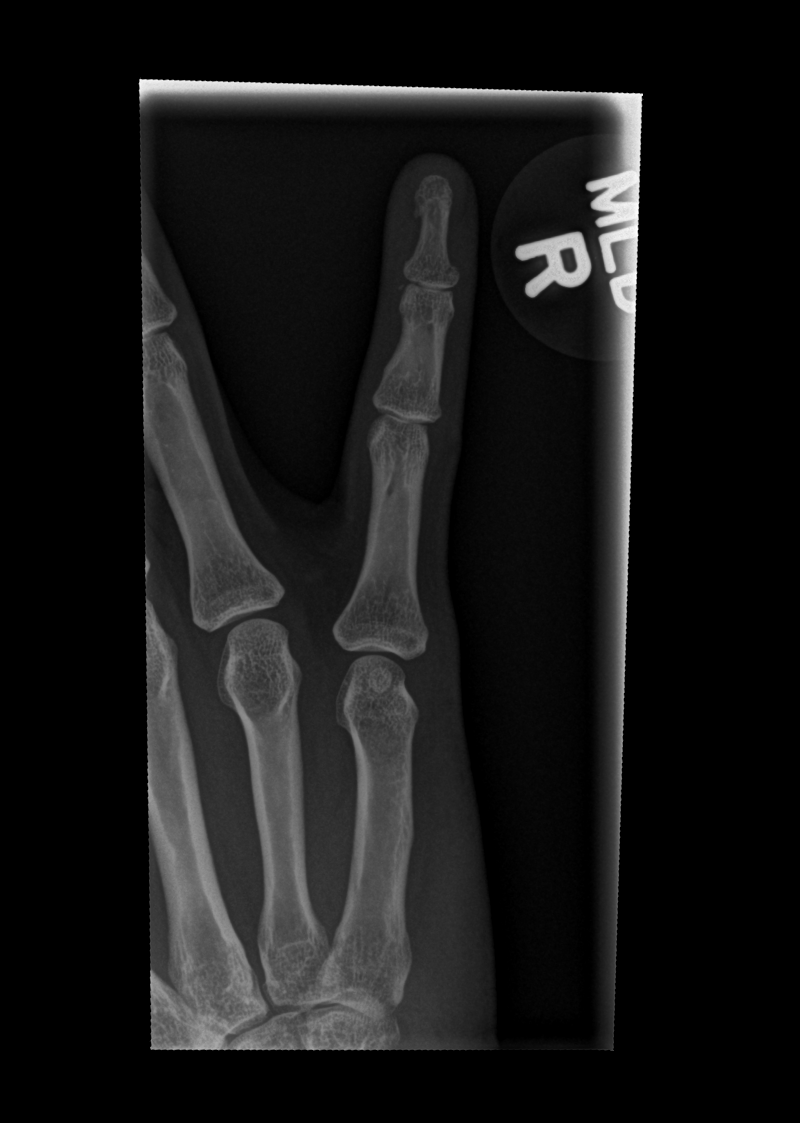

[x finger obl right]
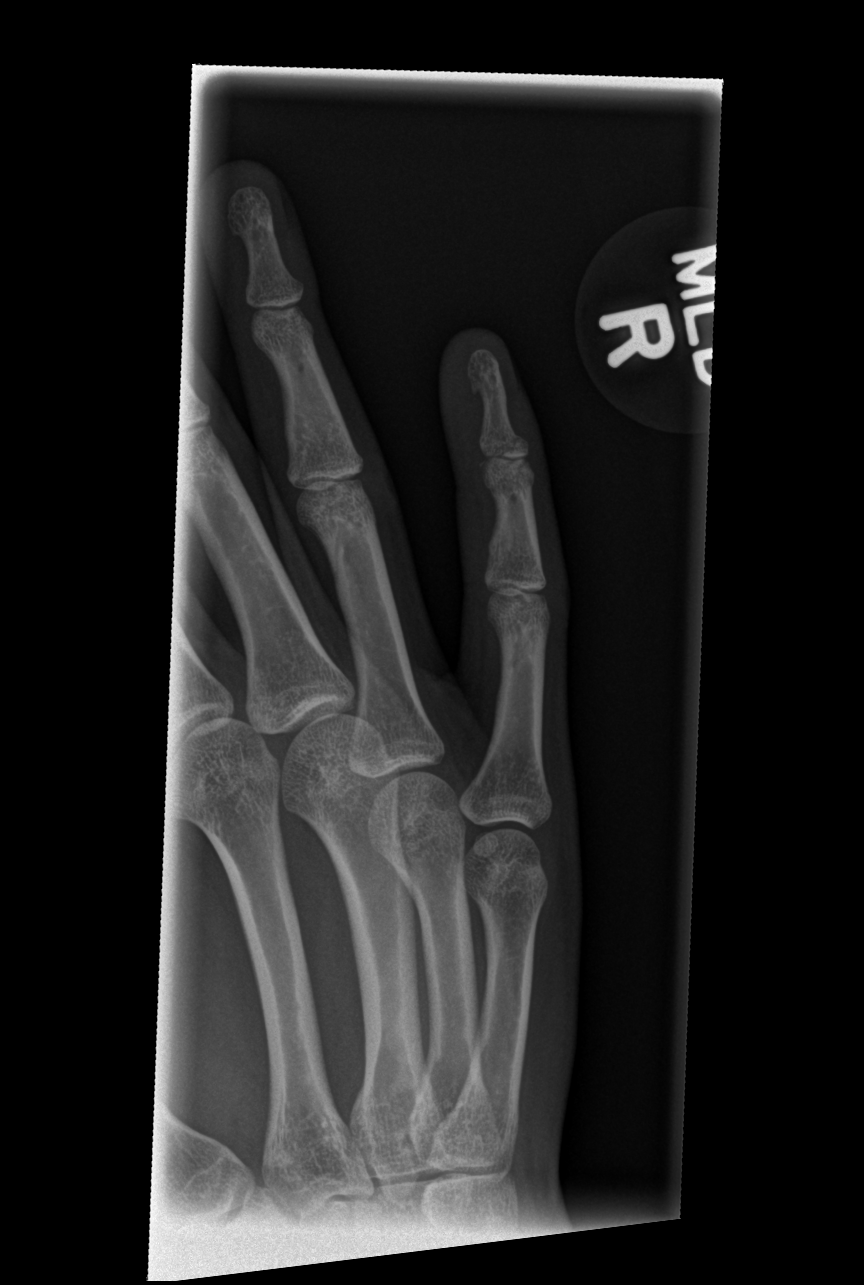

[x finger lat right]
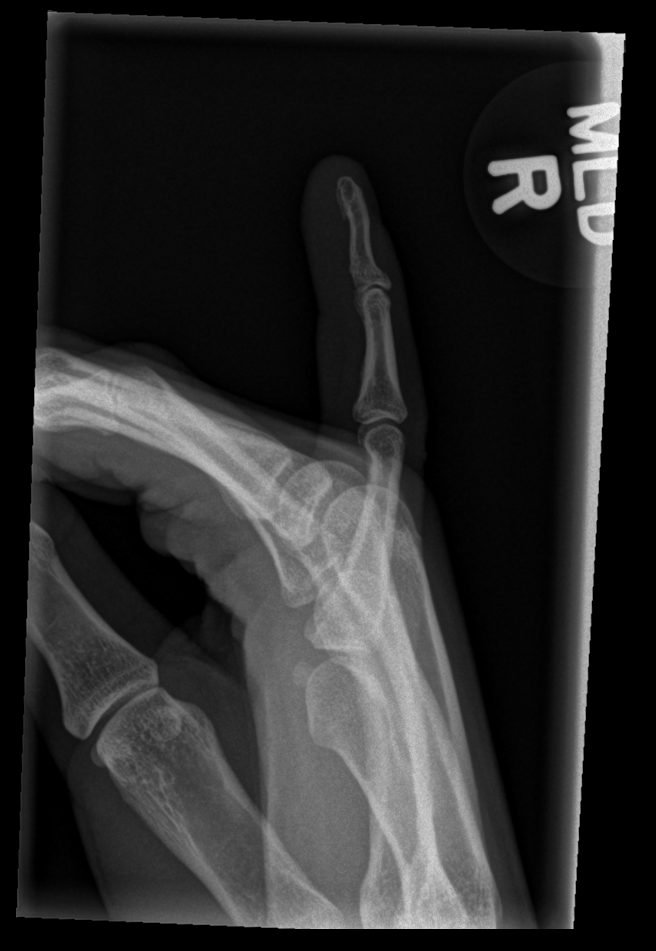

[3 of 3 positions shown; findings below may reference images not displayed]

FINDINGS: Nondisplaced intra-articular fracture involving the base of the
distal phalanx. No other fractures. Soft tissue calcification
adjacent to the DIP joint, likely capsular.
IMPRESSION: Nondisplaced intra-articular fracture involving the base of the
distal phalanx.

## 2015-11-11 ENCOUNTER — Emergency Department (HOSPITAL_COMMUNITY)
Admission: EM | Admit: 2015-11-11 | Discharge: 2015-11-11 | Disposition: A | Discharge status: Home / Self Care | Payer: No Typology Code available for payment source | Attending: Emergency Medicine | Admitting: Emergency Medicine

## 2015-11-11 ENCOUNTER — Encounter (HOSPITAL_COMMUNITY): Payer: Self-pay | Admitting: Emergency Medicine

## 2015-11-11 DIAGNOSIS — R1110 Vomiting, unspecified: Secondary | ICD-10-CM | POA: Diagnosis present

## 2015-11-11 DIAGNOSIS — Z79899 Other long term (current) drug therapy: Secondary | ICD-10-CM | POA: Insufficient documentation

## 2015-11-11 DIAGNOSIS — K529 Noninfective gastroenteritis and colitis, unspecified: Secondary | ICD-10-CM | POA: Diagnosis not present

## 2015-11-11 DIAGNOSIS — Z3202 Encounter for pregnancy test, result negative: Secondary | ICD-10-CM | POA: Diagnosis not present

## 2015-11-11 LAB — CBC
HCT: 42 % (ref 36.0–46.0)
Hemoglobin: 14.3 g/dL (ref 12.0–15.0)
MCH: 31.6 pg (ref 26.0–34.0)
MCHC: 34 g/dL (ref 30.0–36.0)
MCV: 92.7 fL (ref 78.0–100.0)
Platelets: 296 10*3/uL (ref 150–400)
RBC: 4.53 MIL/uL (ref 3.87–5.11)
RDW: 12.1 % (ref 11.5–15.5)
WBC: 6.2 10*3/uL (ref 4.0–10.5)

## 2015-11-11 LAB — COMPREHENSIVE METABOLIC PANEL
ALT: 15 U/L (ref 14–54)
AST: 22 U/L (ref 15–41)
Albumin: 4 g/dL (ref 3.5–5.0)
Alkaline Phosphatase: 69 U/L (ref 38–126)
Anion gap: 9 (ref 5–15)
BUN: 7 mg/dL (ref 6–20)
CALCIUM: 8.9 mg/dL (ref 8.9–10.3)
CHLORIDE: 106 mmol/L (ref 101–111)
CO2: 22 mmol/L (ref 22–32)
Creatinine, Ser: 0.81 mg/dL (ref 0.44–1.00)
GFR calc non Af Amer: >60 mL/min (ref >60)
Glucose, Bld: 104 mg/dL — ABNORMAL HIGH (ref 65–99)
Potassium: 3.9 mmol/L (ref 3.5–5.1)
SODIUM: 137 mmol/L (ref 135–145)
Total Bilirubin: 0.4 mg/dL (ref 0.3–1.2)
Total Protein: 7.4 g/dL (ref 6.5–8.1)

## 2015-11-11 LAB — URINALYSIS, ROUTINE W REFLEX MICROSCOPIC
Glucose, UA: NEGATIVE mg/dL
Ketones, ur: 15 mg/dL — AB
Leukocytes, UA: NEGATIVE
Nitrite: NEGATIVE
Protein, ur: 30 mg/dL — AB
SPECIFIC GRAVITY, URINE: 1.033 — AB (ref 1.005–1.030)
pH: 5 (ref 5.0–8.0)

## 2015-11-11 LAB — URINE MICROSCOPIC-ADD ON: WBC UA: NONE SEEN WBC/hpf (ref 0–5)

## 2015-11-11 LAB — POC URINE PREG, ED: PREG TEST UR: NEGATIVE

## 2015-11-11 LAB — LIPASE, BLOOD: LIPASE: 36 U/L (ref 11–51)

## 2015-11-11 MED ORDER — ONDANSETRON 4 MG PO TBDP: 4.0000 mg | ORAL_TABLET | Freq: Three times a day (TID) | ORAL | Status: DC | PRN

## 2015-11-11 MED ORDER — RANITIDINE HCL 150 MG/10ML PO SYRP
300.0000 mg | ORAL_SOLUTION | Freq: Once | ORAL | Status: AC
  Administered 2015-11-11: 300 mg via ORAL
  Filled 2015-11-11: qty 20

## 2015-11-11 MED ORDER — DICYCLOMINE HCL 10 MG PO CAPS
20.0000 mg | ORAL_CAPSULE | Freq: Once | ORAL | Status: AC
  Administered 2015-11-11: 20 mg via ORAL
  Filled 2015-11-11: qty 2

## 2015-11-11 MED ORDER — ONDANSETRON 4 MG PO TBDP
4.0000 mg | ORAL_TABLET | Freq: Once | ORAL | Status: AC
  Administered 2015-11-11: 4 mg via ORAL
  Filled 2015-11-11: qty 1

## 2015-11-11 NOTE — Discharge Instructions (Signed)
Viral Gastroenteritis Ms. Arrighi, take zofran as needed for vomiting.  See a primary care doctor within 3 days for close follow up.  If symptoms worsen, come back to the ED immediately.  Thank you. Viral gastroenteritis is also called stomach flu. This illness is caused by a certain type of germ (virus). It can cause sudden watery poop (diarrhea) and throwing up (vomiting). This can cause you to lose body fluids (dehydration). This illness usually lasts for 3 to 8 days. It usually goes away on its own. HOME CARE   Drink enough fluids to keep your pee (urine) clear or pale yellow. Drink small amounts of fluids often.  Ask your doctor how to replace body fluid losses (rehydration).  Avoid:  Foods high in sugar.  Alcohol.  Bubbly (carbonated) drinks.  Tobacco.  Juice.  Caffeine drinks.  Very hot or cold fluids.  Fatty, greasy foods.  Eating too much at one time.  Dairy products until 24 to 48 hours after your watery poop stops.  You may eat foods with active cultures (probiotics). They can be found in some yogurts and supplements.  Wash your hands well to avoid spreading the illness.  Only take medicines as told by your doctor. Do not give aspirin to children. Do not take medicines for watery poop (antidiarrheals).  Ask your doctor if you should keep taking your regular medicines.  Keep all doctor visits as told. GET HELP RIGHT AWAY IF:   You cannot keep fluids down.  You do not pee at least once every 6 to 8 hours.  You are short of breath.  You see blood in your poop or throw up. This may look like coffee grounds.  You have belly (abdominal) pain that gets worse or is just in one small spot (localized).  You keep throwing up or having watery poop.  You have a fever.  The patient is a child younger than 3 months, and he or she has a fever.  The patient is a child older than 3 months, and he or she has a fever and problems that do not go away.  The patient is  a child older than 3 months, and he or she has a fever and problems that suddenly get worse.  The patient is a baby, and he or she has no tears when crying. MAKE SURE YOU:   Understand these instructions.  Will watch your condition.  Will get help right away if you are not doing well or get worse.   This information is not intended to replace advice given to you by your health care provider. Make sure you discuss any questions you have with your health care provider.   Document Released: 05/16/2008 Document Revised: 02/20/2012 Document Reviewed: 09/14/2011 Elsevier Interactive Patient Education [employer]vier Inc.

## 2015-11-11 NOTE — ED Provider Notes (Signed)
CSN: [id number]     Arrival date & time 11/11/15  0006 History   By signing my name below, I, Freida Busmaniana Omoyeni, attest that this documentation has been prepared under the direction and in the presence of Tomasita CrumbleAdeleke Evanna Washinton, MD . Electronically Signed: Freida Busmaniana Omoyeni, Scribe. 11/11/2015. 1:20 AM.   Chief Complaint  Patient presents with  . Emesis   The history is provided by the patient. No language interpreter was used.     HPI Comments:  Carolyn Harrington is a 24 y.o. female who presents to the Emergency Department complaining of "uncontrollable" vomiting since yesterday morning (11/10/15). She reports 2 episodes of diarrhea, associated abdominal pain which preceded the vomiting and nausea. She also notes decreased urination secondary to decreased PO intake. She notes sick contacts at home in her son and husband with same symptoms; pt believes she may have eaten bad ground beef. She denies fever, vaginal bleeding, and vaginal discharge. No new medications. No alleviating factors noted.   Past Medical History  Diagnosis Date  . Headache(784.0)   . Late prenatal care    Past Surgical History  Procedure Laterality Date  . No past surgeries     Family History  Problem Relation Age of Onset  . Hypertension Maternal Aunt   . Diabetes Maternal Grandmother   . Cancer Maternal Grandmother     pancreatic  . Diabetes Paternal Grandmother   . Heart disease Mother     heart valve issue  . Migraines Father   . Hypertension Paternal Aunt   . Diabetes Paternal Aunt   . Asthma Cousin    Social History  Substance Use Topics  . Smoking status: Never Smoker   . Smokeless tobacco: Never Used  . Alcohol Use: No   OB History    Gravida Para Term Preterm AB TAB SAB Ectopic Multiple Living   4 1 1  0 1 1 0 0 0 1     Review of Systems  10 systems reviewed and all are negative for acute change except as noted in the HPI.  Allergies  Review of patient's allergies indicates no known allergies.  Home  Medications   Prior to Admission medications   Medication Sig Start Date End Date Taking? Authorizing Provider  ibuprofen (ADVIL,MOTRIN) 600 MG tablet Take 1 tablet (600 mg total) by mouth every 6 (six) hours as needed. 03/01/14   Antony MaduraKelly Humes, PA-C  levonorgestrel-ethinyl estradiol (LUTERA) 0.1-20 MG-MCG tablet Take 1 tablet by mouth daily.    Historical Provider, MD  Multiple Vitamins-Minerals (HAIR/SKIN/NAILS PO) Take 2 tablets by mouth daily.    Historical Provider, MD  ondansetron (ZOFRAN ODT) 4 MG disintegrating tablet Take 1 tablet (4 mg total) by mouth every 8 (eight) hours as needed for nausea or vomiting. 11/11/15   Tomasita CrumbleAdeleke Jaymie Misch, MD  topiramate (TOPAMAX) 25 MG tablet Take 75 mg by mouth at bedtime.    Historical Provider, MD   BP 107/92 mmHg  Pulse 98  Temp(Src) 99.2 F (37.3 C) (Oral)  Resp 28  Ht 5\' 1"  (1.549 m)  Wt 155 lb (70.308 kg)  BMI 29.30 kg/m2  SpO2 100%  LMP 11/04/2015 (Approximate) Physical Exam  Constitutional: She is oriented to person, place, and time. She appears well-developed and well-nourished. No distress.  HENT:  Head: Normocephalic and atraumatic.  Nose: Nose normal.  Mouth/Throat: Oropharynx is clear and moist. No oropharyngeal exudate.  Eyes: Conjunctivae and EOM are normal. Pupils are equal, round, and reactive to light. No scleral icterus.  Neck: Normal  range of motion. Neck supple. No JVD present. No tracheal deviation present. No thyromegaly present.  Cardiovascular: Normal rate, regular rhythm and normal heart sounds.  Exam reveals no gallop and no friction rub.   No murmur heard. Pulmonary/Chest: Effort normal and breath sounds normal. No respiratory distress. She has no wheezes. She exhibits no tenderness.  Abdominal: Soft. Bowel sounds are normal. She exhibits no distension and no mass. There is no tenderness. There is no rebound and no guarding.  Musculoskeletal: Normal range of motion. She exhibits no edema or tenderness.  Lymphadenopathy:     She has no cervical adenopathy.  Neurological: She is alert and oriented to person, place, and time. No cranial nerve deficit. She exhibits normal muscle tone.  Skin: Skin is warm and dry. No rash noted. No erythema. No pallor.  Nursing note and vitals reviewed.   ED Course  Procedures   DIAGNOSTIC STUDIES:  Oxygen Saturation is 98% on RA, normal by my interpretation.    COORDINATION OF CARE:  1:18 AM Discussed treatment plan with pt at bedside and pt agreed to plan.   Labs Review Labs Reviewed  COMPREHENSIVE METABOLIC PANEL - Abnormal; Notable for the following:    Glucose, Bld 104 (*)    All other components within normal limits  URINALYSIS, ROUTINE W REFLEX MICROSCOPIC (NOT AT [facility]) - Abnormal; Notable for the following:    APPearance CLOUDY (*)    Specific Gravity, Urine 1.033 (*)    Hgb urine dipstick SMALL (*)    Bilirubin Urine SMALL (*)    Ketones, ur 15 (*)    Protein, ur 30 (*)    All other components within normal limits  URINE MICROSCOPIC-ADD ON - Abnormal; Notable for the following:    Squamous Epithelial / LPF 0-5 (*)    Bacteria, UA MANY (*)    All other components within normal limits  LIPASE, BLOOD  CBC  POC URINE PREG, ED    Imaging Review No results found. I have personally reviewed and evaluated these lab results as part of my medical decision-making.    MDM   Final diagnoses:  Gastroenteritis   Patient presents to the emergency department for nausea vomiting and abdominal pain. She has positive sick contacts with the same symptoms. She was given Zofran, Bentyl, Zantac for her symptoms. We'll by mouth challenge prior to discharge. Labs or studies do not show any significant dehydration. Urinalysis shows many bacteria, but no nitrites or white blood cells. In addition patient is asymptomatic. Patient will be discharged with Zofran to take as needed. She appears well in no acute distress, vital signs within her normal limits.    I personally  performed the services described in this documentation, which was scribed in my presence. The recorded information has been reviewed and is accurate.      Tomasita Crumble, MD 11/11/15 [phone]

## 2015-11-11 NOTE — ED Notes (Signed)
Pt. reports multiple emesis with diarrhea and generalized abdominal pain onset this morning , pt. added mild chills and body aches.

## 2015-11-11 NOTE — ED Notes (Signed)
Pt has vomited  

## 2017-10-19 LAB — OB RESULTS CONSOLE RPR: RPR: NONREACTIVE

## 2017-10-19 LAB — OB RESULTS CONSOLE GC/CHLAMYDIA
CHLAMYDIA, DNA PROBE: NEGATIVE
GC PROBE AMP, GENITAL: NEGATIVE

## 2017-10-19 LAB — OB RESULTS CONSOLE ABO/RH: RH Type: POSITIVE

## 2017-10-19 LAB — OB RESULTS CONSOLE HIV ANTIBODY (ROUTINE TESTING): HIV: NONREACTIVE

## 2017-10-19 LAB — OB RESULTS CONSOLE HEPATITIS B SURFACE ANTIGEN: Hepatitis B Surface Ag: NEGATIVE

## 2017-10-19 LAB — OB RESULTS CONSOLE RUBELLA ANTIBODY, IGM: Rubella: IMMUNE

## 2017-12-12 NOTE — L&D Delivery Note (Signed)
Delivery Note At  a viable female was delivered via  (Presentation:ROA ;  ).  APGAR:8 ,9 ; weight pending  .   Placenta status: complete, . 3V Cord:  with the following complications:None .   Anesthesia:  Epidural Episiotomy:  N/A Lacerations:  1st Suture Repair: vicryl 4-0 Est. Blood Loss (mL):100c    Mom to postpartum.  Baby to Couplet care / Skin to Skin.  Henderson Newcomer Shannel Zahm 04/15/2018, 10:24 PM

## 2018-03-22 LAB — OB RESULTS CONSOLE GBS: STREP GROUP B AG: POSITIVE

## 2018-03-23 ENCOUNTER — Other Ambulatory Visit: Payer: Self-pay

## 2018-03-23 ENCOUNTER — Encounter (HOSPITAL_COMMUNITY): Payer: Self-pay | Admitting: *Deleted

## 2018-03-23 ENCOUNTER — Inpatient Hospital Stay (HOSPITAL_COMMUNITY)
Admission: AD | Admit: 2018-03-23 | Discharge: 2018-03-23 | Disposition: A | Discharge status: Home / Self Care | Payer: Medicaid Other | Source: Ambulatory Visit | Attending: Obstetrics & Gynecology | Admitting: Obstetrics & Gynecology

## 2018-03-23 DIAGNOSIS — Z3689 Encounter for other specified antenatal screening: Secondary | ICD-10-CM

## 2018-03-23 DIAGNOSIS — N949 Unspecified condition associated with female genital organs and menstrual cycle: Secondary | ICD-10-CM

## 2018-03-23 DIAGNOSIS — O98813 Other maternal infectious and parasitic diseases complicating pregnancy, third trimester: Secondary | ICD-10-CM | POA: Diagnosis not present

## 2018-03-23 DIAGNOSIS — Z3A36 36 weeks gestation of pregnancy: Secondary | ICD-10-CM

## 2018-03-23 DIAGNOSIS — B373 Candidiasis of vulva and vagina: Secondary | ICD-10-CM | POA: Diagnosis not present

## 2018-03-23 DIAGNOSIS — O9989 Other specified diseases and conditions complicating pregnancy, childbirth and the puerperium: Secondary | ICD-10-CM | POA: Diagnosis not present

## 2018-03-23 DIAGNOSIS — B3731 Acute candidiasis of vulva and vagina: Secondary | ICD-10-CM

## 2018-03-23 DIAGNOSIS — R1031 Right lower quadrant pain: Secondary | ICD-10-CM | POA: Diagnosis present

## 2018-03-23 LAB — URINALYSIS, ROUTINE W REFLEX MICROSCOPIC
Bilirubin Urine: NEGATIVE
Glucose, UA: 150 mg/dL — AB
Hgb urine dipstick: NEGATIVE
Ketones, ur: 20 mg/dL — AB
Nitrite: NEGATIVE
Protein, ur: NEGATIVE mg/dL
Specific Gravity, Urine: 1.014 (ref 1.005–1.030)
pH: 7 (ref 5.0–8.0)

## 2018-03-23 LAB — WET PREP, GENITAL
Clue Cells Wet Prep HPF POC: NONE SEEN
Sperm: NONE SEEN
TRICH WET PREP: NONE SEEN
YEAST WET PREP: NONE SEEN

## 2018-03-23 MED ORDER — TERCONAZOLE 0.4 % VA CREA: 1.0000 | TOPICAL_CREAM | Freq: Every day | VAGINAL | 0 refills | Status: DC

## 2018-03-23 NOTE — MAU Provider Note (Signed)
History     CSN: [id number]  Arrival date and time: 03/23/18 1359   First Provider Initiated Contact with Patient 03/23/18 1450      Chief Complaint  Patient presents with  . Abdominal Pain  . Rupture of Membranes   [id number] @36 .0 wks here with RLQ pain. She reports feeling a pulling type pain around 1315. She was not feeling the baby move at that time and go concerned. She had been feeling good FM before. She massaged the area and since pain is almost resolved. She is feeling good FM since arrival here. She also reports her underwear feeling wet at the same time. Unsure of color. Denies VB or ctx. No recent IC.    OB History    Gravida  4   Para  1   Term  1   Preterm  0   AB  2   Living  1     SAB  0   TAB  2   Ectopic  0   Multiple  0   Live Births  1           Past Medical History:  Diagnosis Date  . Headache(784.0)   . Late prenatal care     Past Surgical History:  Procedure Laterality Date  . TONSILLECTOMY      Family History  Problem Relation Age of Onset  . Hypertension Maternal Aunt   . Diabetes Maternal Grandmother   . Cancer Maternal Grandmother        pancreatic  . Diabetes Paternal Grandmother   . Heart disease Mother        heart valve issue  . Migraines Father   . Hypertension Paternal Aunt   . Diabetes Paternal Aunt   . Asthma Cousin     Social History   Tobacco Use  . Smoking status: Never Smoker  . Smokeless tobacco: Never Used  Substance Use Topics  . Alcohol use: No  . Drug use: No    Allergies: No Known Allergies  Medications Prior to Admission  Medication Sig Dispense Refill Last Dose  . acetaminophen (TYLENOL) 500 MG tablet Take 500 mg by mouth every 6 (six) hours as needed for mild pain.   Past Week at Unknown time  . Butalbital-APAP-Caffeine 50-300-40 MG CAPS TK 1 C PO Q 4 H PRN  1 Past Week at Unknown time  . Prenatal Vit-Fe Fumarate-FA (PRENATAL MULTIVITAMIN) TABS tablet Take 1 tablet by mouth daily at  12 noon.   Past Month at Unknown time    Review of Systems  Constitutional: Negative for fever.  Gastrointestinal: Positive for abdominal pain.  Genitourinary: Positive for vaginal discharge. Negative for dysuria, hematuria, urgency and vaginal bleeding.   Physical Exam   Temperature 98.6 F (37 C), temperature source Oral, resp. rate 18, height 5\' 2"  (1.575 m), weight 152 lb 12 oz (69.3 kg), SpO2 100 %.  Physical Exam  Constitutional: She is oriented to person, place, and time. She appears well-developed and well-nourished. No distress.  HENT:  Head: Normocephalic and atraumatic.  Neck: Normal range of motion.  Respiratory: Effort normal. No respiratory distress.  GI: Soft. She exhibits no distension. There is no tenderness.  gravid  Genitourinary:  Genitourinary Comments: SSE: +thick curdy white discharge, no pool, fern neg SVE closed/thick  Musculoskeletal: Normal range of motion.  Neurological: She is alert and oriented to person, place, and time.  Skin: Skin is warm and dry.  Psychiatric: She has a normal mood and affect.  EFM: 150 bpm, mod variability, + accels, no decels Toco: irritability  Results for orders placed or performed during the hospital encounter of 03/23/18 (from the past 24 hour(s))  Urinalysis, Routine w reflex microscopic     Status: Abnormal   Collection Time: 03/23/18  2:10 PM  Result Value Ref Range   Color, Urine YELLOW YELLOW   APPearance CLOUDY (A) CLEAR   Specific Gravity, Urine 1.014 1.005 - 1.030   pH 7.0 5.0 - 8.0   Glucose, UA 150 (A) NEGATIVE mg/dL   Hgb urine dipstick NEGATIVE NEGATIVE   Bilirubin Urine NEGATIVE NEGATIVE   Ketones, ur 20 (A) NEGATIVE mg/dL   Protein, ur NEGATIVE NEGATIVE mg/dL   Nitrite NEGATIVE NEGATIVE   Leukocytes, UA TRACE (A) NEGATIVE   RBC / HPF 0-5 0 - 5 RBC/hpf   WBC, UA 6-30 0 - 5 WBC/hpf   Bacteria, UA MANY (A) NONE SEEN   Squamous Epithelial / LPF 0-5 (A) NONE SEEN   Mucus PRESENT   Wet prep, genital      Status: Abnormal   Collection Time: 03/23/18  3:02 PM  Result Value Ref Range   Yeast Wet Prep HPF POC NONE SEEN NONE SEEN   Trich, Wet Prep NONE SEEN NONE SEEN   Clue Cells Wet Prep HPF POC NONE SEEN NONE SEEN   WBC, Wet Prep HPF POC MANY (A) NONE SEEN   Sperm NONE SEEN    MAU Course  Procedures  MDM Labs ordered and reviewed. Will treat yeast vaginitis. UC sent d/t many bacteria, pt asymptomatic. No signs of acute abdomen or PTL, pain likely RL. Presentation, clinical findings, and plan discussed with Dr. Sallye Ober. Stable for discharge home.  Assessment and Plan   1. [redacted] weeks gestation of pregnancy   2. NST (non-stress test) reactive   3. Vaginal yeast infection   4. Round ligament pain    Discharge home Follow up in OB office next week as scheduled Comfort measures for RL pain PTL precautions Rx Terazol  Allergies as of 03/23/2018   No Known Allergies     Medication List    TAKE these medications   acetaminophen 500 MG tablet Commonly known as:  TYLENOL Take 500 mg by mouth every 6 (six) hours as needed for mild pain.   Butalbital-APAP-Caffeine 50-300-40 MG Caps TK 1 C PO Q 4 H PRN   prenatal multivitamin Tabs tablet Take 1 tablet by mouth daily at 12 noon.   terconazole 0.4 % vaginal cream Commonly known as:  TERAZOL 7 Place 1 applicator vaginally at bedtime.      Donette Larry, CNM 03/23/2018, 3:51 PM

## 2018-03-23 NOTE — Discharge Instructions (Signed)
Vaginal Yeast infection, Adult Vaginal yeast infection is a condition that causes soreness, swelling, and redness (inflammation) of the vagina. It also causes vaginal discharge. This is a common condition. Some women get this infection frequently. What are the causes? This condition is caused by a change in the normal balance of the yeast (candida) and bacteria that live in the vagina. This change causes an overgrowth of yeast, which causes the inflammation. What increases the risk? This condition is more likely to develop in:  Women who take antibiotic medicines.  Women who have diabetes.  Women who take birth control pills.  Women who are pregnant.  Women who douche often.  Women who have a weak defense (immune) system.  Women who have been taking steroid medicines for a long time.  Women who frequently wear tight clothing.  What are the signs or symptoms? Symptoms of this condition include:  White, thick vaginal discharge.  Swelling, itching, redness, and irritation of the vagina. The lips of the vagina (vulva) may be affected as well.  Pain or a burning feeling while urinating.  Pain during sex.  How is this diagnosed? This condition is diagnosed with a medical history and physical exam. This will include a pelvic exam. Your health care provider will examine a sample of your vaginal discharge under a microscope. Your health care provider may send this sample for testing to confirm the diagnosis. How is this treated? This condition is treated with medicine. Medicines may be over-the-counter or prescription. You may be told to use one or more of the following:  Medicine that is taken orally.  Medicine that is applied as a cream.  Medicine that is inserted directly into the vagina (suppository).  Follow these instructions at home:  Take or apply over-the-counter and prescription medicines only as told by your health care provider.  Do not have sex until your health  care provider has approved. Tell your sex partner that you have a yeast infection. That person should go to his or her health care provider if he or she develops symptoms.  Do not wear tight clothes, such as pantyhose or tight pants.  Avoid using tampons until your health care provider approves.  Eat more yogurt. This may help to keep your yeast infection from returning.  Try taking a sitz bath to help with discomfort. This is a warm water bath that is taken while you are sitting down. The water should only come up to your hips and should cover your buttocks. Do this 3-4 times per day or as told by your health care provider.  Do not douche.  Wear breathable, cotton underwear.  If you have diabetes, keep your blood sugar levels under control. Contact a health care provider if:  You have a fever.  Your symptoms go away and then return.  Your symptoms do not get better with treatment.  Your symptoms get worse.  You have new symptoms.  You develop blisters in or around your vagina.  You have blood coming from your vagina and it is not your menstrual period.  You develop pain in your abdomen. This information is not intended to replace advice given to you by your health care provider. Make sure you discuss any questions you have with your health care provider. Document Released: 09/07/2005 Document Revised: 05/11/2016 Document Reviewed: 06/01/2015 Elsevier Interactive Patient Education  2018 [employer] Round Ligament Pain The round ligament is a cord of muscle and tissue that helps to support the uterus. It can  become a source of pain during pregnancy if it becomes stretched or twisted as the baby grows. The pain usually begins in the second trimester of pregnancy, and it can come and go until the baby is delivered. It is not a serious problem, and it does not cause harm to the baby. °Round ligament pain is usually a short, sharp, and pinching pain, but it can also be a dull,  lingering, and aching pain. The pain is felt in the lower side of the abdomen or in the groin. It usually starts deep in the groin and moves up to the outside of the hip area. Pain can occur with: °· A sudden change in position. °· Rolling over in bed. °· Coughing or sneezing. °· Physical activity. ° °Follow these instructions at home: °Watch your condition for any changes. Take these steps to help with your pain: °· When the pain starts, relax. Then try: °? Sitting down. °? Flexing your knees up to your abdomen. °? Lying on your side with one pillow under your abdomen and another pillow between your legs. °? Sitting in a warm bath for 15-20 minutes or until the pain goes away. °· Take over-the-counter and prescription medicines only as told by your health care provider. °· Move slowly when you sit and stand. °· Avoid long walks if they cause pain. °· Stop or lessen your physical activities if they cause pain. ° °Contact a health care provider if: °· Your pain does not go away with treatment. °· You feel pain in your back that you did not have before. °· Your medicine is not helping. °Get help right away if: °· You develop a fever or chills. °· You develop uterine contractions. °· You develop vaginal bleeding. °· You develop nausea or vomiting. °· You develop diarrhea. °· You have pain when you urinate. °This information is not intended to replace advice given to you by your health care provider. Make sure you discuss any questions you have with your health care provider. °Document Released: 09/06/2008 Document Revised: 05/05/2016 Document Reviewed: 02/04/2015 °Elsevier Interactive Patient Education © 2018 [employer] Inc[employer] ° °

## 2018-03-23 NOTE — MAU Note (Signed)
Pt presents with c/o right lower abdominal pain that began 1315 this afternoon.  Pt reports feels like a pulled muscled and pain caused difficulty walking.  Denies VB, reports was having LOF, but anymore.  Reports +FM.

## 2018-03-24 LAB — CULTURE, OB URINE: Culture: <10000 — AB

## 2018-04-15 ENCOUNTER — Inpatient Hospital Stay (HOSPITAL_COMMUNITY)
Admission: AD | Admit: 2018-04-15 | Discharge: 2018-04-15 | Disposition: A | Discharge status: Home / Self Care | Payer: Medicaid Other | Source: Ambulatory Visit | Attending: Obstetrics and Gynecology | Admitting: Obstetrics and Gynecology

## 2018-04-15 ENCOUNTER — Encounter (HOSPITAL_COMMUNITY): Payer: Self-pay | Admitting: *Deleted

## 2018-04-15 ENCOUNTER — Inpatient Hospital Stay (HOSPITAL_COMMUNITY): Payer: Medicaid Other | Admitting: Anesthesiology

## 2018-04-15 ENCOUNTER — Inpatient Hospital Stay (HOSPITAL_COMMUNITY)
Admission: AD | Admit: 2018-04-15 | Discharge: 2018-04-17 | DRG: 807 | Disposition: A | Discharge status: Home / Self Care | Payer: Medicaid Other | Source: Ambulatory Visit | Attending: Obstetrics and Gynecology | Admitting: Obstetrics and Gynecology

## 2018-04-15 DIAGNOSIS — Z3483 Encounter for supervision of other normal pregnancy, third trimester: Secondary | ICD-10-CM | POA: Diagnosis present

## 2018-04-15 DIAGNOSIS — O479 False labor, unspecified: Secondary | ICD-10-CM

## 2018-04-15 DIAGNOSIS — Z3A39 39 weeks gestation of pregnancy: Secondary | ICD-10-CM | POA: Diagnosis not present

## 2018-04-15 DIAGNOSIS — O99824 Streptococcus B carrier state complicating childbirth: Principal | ICD-10-CM | POA: Diagnosis present

## 2018-04-15 LAB — CBC
HCT: 35.8 % — ABNORMAL LOW (ref 36.0–46.0)
Hemoglobin: 12 g/dL (ref 12.0–15.0)
MCH: 29.9 pg (ref 26.0–34.0)
MCHC: 33.5 g/dL (ref 30.0–36.0)
MCV: 89.3 fL (ref 78.0–100.0)
PLATELETS: 232 10*3/uL (ref 150–400)
RBC: 4.01 MIL/uL (ref 3.87–5.11)
RDW: 12.9 % (ref 11.5–15.5)
WBC: 11.7 10*3/uL — ABNORMAL HIGH (ref 4.0–10.5)

## 2018-04-15 LAB — TYPE AND SCREEN
ABO/RH(D): AB POS
Antibody Screen: NEGATIVE

## 2018-04-15 MED ORDER — ONDANSETRON HCL 4 MG/2ML IJ SOLN: 4.0000 mg | Freq: Four times a day (QID) | INTRAMUSCULAR | Status: DC | PRN

## 2018-04-15 MED ORDER — PHENYLEPHRINE 40 MCG/ML (10ML) SYRINGE FOR IV PUSH (FOR BLOOD PRESSURE SUPPORT)
80.0000 ug | PREFILLED_SYRINGE | INTRAVENOUS | Status: DC | PRN
  Filled 2018-04-15: qty 10
  Filled 2018-04-15: qty 5

## 2018-04-15 MED ORDER — PENICILLIN G POT IN DEXTROSE 60000 UNIT/ML IV SOLN
3.0000 10*6.[IU] | INTRAVENOUS | Status: DC
  Filled 2018-04-15: qty 50

## 2018-04-15 MED ORDER — OXYTOCIN 40 UNITS IN LACTATED RINGERS INFUSION - SIMPLE MED
2.5000 [IU]/h | INTRAVENOUS | Status: DC
  Administered 2018-04-15: 2.5 [IU]/h via INTRAVENOUS
  Filled 2018-04-15: qty 1000

## 2018-04-15 MED ORDER — ONDANSETRON HCL 4 MG PO TABS: 4.0000 mg | ORAL_TABLET | ORAL | Status: DC | PRN

## 2018-04-15 MED ORDER — SIMETHICONE 80 MG PO CHEW: 80.0000 mg | CHEWABLE_TABLET | ORAL | Status: DC | PRN

## 2018-04-15 MED ORDER — ACETAMINOPHEN 325 MG PO TABS
650.0000 mg | ORAL_TABLET | ORAL | Status: DC | PRN
  Filled 2018-04-15: qty 2

## 2018-04-15 MED ORDER — DIPHENHYDRAMINE HCL 50 MG/ML IJ SOLN: 12.5000 mg | INTRAMUSCULAR | Status: DC | PRN

## 2018-04-15 MED ORDER — FLEET ENEMA 7-19 GM/118ML RE ENEM: 1.0000 | ENEMA | RECTAL | Status: DC | PRN

## 2018-04-15 MED ORDER — PHENYLEPHRINE 40 MCG/ML (10ML) SYRINGE FOR IV PUSH (FOR BLOOD PRESSURE SUPPORT)
80.0000 ug | PREFILLED_SYRINGE | INTRAVENOUS | Status: DC | PRN
  Filled 2018-04-15: qty 5

## 2018-04-15 MED ORDER — WITCH HAZEL-GLYCERIN EX PADS: 1.0000 "application " | MEDICATED_PAD | CUTANEOUS | Status: DC | PRN

## 2018-04-15 MED ORDER — LIDOCAINE HCL (PF) 1 % IJ SOLN
INTRAMUSCULAR | Status: DC | PRN
  Administered 2018-04-15: 5 mL via EPIDURAL

## 2018-04-15 MED ORDER — IBUPROFEN 600 MG PO TABS
600.0000 mg | ORAL_TABLET | Freq: Four times a day (QID) | ORAL | Status: DC
  Administered 2018-04-16 – 2018-04-17 (×7): 600 mg via ORAL
  Filled 2018-04-15 (×7): qty 1

## 2018-04-15 MED ORDER — TETANUS-DIPHTH-ACELL PERTUSSIS 5-2.5-18.5 LF-MCG/0.5 IM SUSP: 0.5000 mL | Freq: Once | INTRAMUSCULAR | Status: DC

## 2018-04-15 MED ORDER — SODIUM CHLORIDE 0.9 % IV SOLN
5.0000 10*6.[IU] | Freq: Once | INTRAVENOUS | Status: AC
  Administered 2018-04-15: 5 10*6.[IU] via INTRAVENOUS
  Filled 2018-04-15: qty 5

## 2018-04-15 MED ORDER — FENTANYL 2.5 MCG/ML BUPIVACAINE 1/10 % EPIDURAL INFUSION (WH - ANES)
14.0000 mL/h | INTRAMUSCULAR | Status: DC | PRN
  Administered 2018-04-15: 14 mL/h via EPIDURAL
  Filled 2018-04-15: qty 100

## 2018-04-15 MED ORDER — ZOLPIDEM TARTRATE 5 MG PO TABS: 5.0000 mg | ORAL_TABLET | Freq: Every evening | ORAL | Status: DC | PRN

## 2018-04-15 MED ORDER — PRENATAL MULTIVITAMIN CH
1.0000 | ORAL_TABLET | Freq: Every day | ORAL | Status: DC
  Administered 2018-04-16 – 2018-04-17 (×2): 1 via ORAL
  Filled 2018-04-15 (×2): qty 1

## 2018-04-15 MED ORDER — EPHEDRINE 5 MG/ML INJ
10.0000 mg | INTRAVENOUS | Status: DC | PRN
  Filled 2018-04-15: qty 2

## 2018-04-15 MED ORDER — SENNOSIDES-DOCUSATE SODIUM 8.6-50 MG PO TABS
2.0000 | ORAL_TABLET | ORAL | Status: DC
  Administered 2018-04-16 – 2018-04-17 (×2): 2 via ORAL
  Filled 2018-04-15 (×2): qty 2

## 2018-04-15 MED ORDER — OXYCODONE-ACETAMINOPHEN 5-325 MG PO TABS: 1.0000 | ORAL_TABLET | ORAL | Status: DC | PRN

## 2018-04-15 MED ORDER — SOD CITRATE-CITRIC ACID 500-334 MG/5ML PO SOLN: 30.0000 mL | ORAL | Status: DC | PRN

## 2018-04-15 MED ORDER — ACETAMINOPHEN 325 MG PO TABS: 650.0000 mg | ORAL_TABLET | ORAL | Status: DC | PRN

## 2018-04-15 MED ORDER — LIDOCAINE HCL (PF) 1 % IJ SOLN
30.0000 mL | INTRAMUSCULAR | Status: DC | PRN
  Filled 2018-04-15: qty 30

## 2018-04-15 MED ORDER — COCONUT OIL OIL: 1.0000 "application " | TOPICAL_OIL | Status: DC | PRN

## 2018-04-15 MED ORDER — LACTATED RINGERS IV SOLN
500.0000 mL | Freq: Once | INTRAVENOUS | Status: AC
  Administered 2018-04-15: 500 mL via INTRAVENOUS

## 2018-04-15 MED ORDER — BENZOCAINE-MENTHOL 20-0.5 % EX AERO
1.0000 "application " | INHALATION_SPRAY | CUTANEOUS | Status: DC | PRN
  Administered 2018-04-16: 1 via TOPICAL
  Filled 2018-04-15: qty 56

## 2018-04-15 MED ORDER — LACTATED RINGERS IV SOLN: 500.0000 mL | INTRAVENOUS | Status: DC | PRN

## 2018-04-15 MED ORDER — LACTATED RINGERS IV SOLN
INTRAVENOUS | Status: DC
  Administered 2018-04-15 (×2): via INTRAVENOUS

## 2018-04-15 MED ORDER — OXYTOCIN BOLUS FROM INFUSION
500.0000 mL | Freq: Once | INTRAVENOUS | Status: AC
  Administered 2018-04-15: 500 mL via INTRAVENOUS

## 2018-04-15 MED ORDER — ONDANSETRON HCL 4 MG/2ML IJ SOLN: 4.0000 mg | INTRAMUSCULAR | Status: DC | PRN

## 2018-04-15 MED ORDER — DIPHENHYDRAMINE HCL 25 MG PO CAPS: 25.0000 mg | ORAL_CAPSULE | Freq: Four times a day (QID) | ORAL | Status: DC | PRN

## 2018-04-15 MED ORDER — OXYCODONE-ACETAMINOPHEN 5-325 MG PO TABS: 2.0000 | ORAL_TABLET | ORAL | Status: DC | PRN

## 2018-04-15 MED ORDER — LACTATED RINGERS IV SOLN: 500.0000 mL | Freq: Once | INTRAVENOUS | Status: DC

## 2018-04-15 MED ORDER — DIBUCAINE 1 % RE OINT: 1.0000 "application " | TOPICAL_OINTMENT | RECTAL | Status: DC | PRN

## 2018-04-15 NOTE — Discharge Instructions (Signed)
Braxton Hicks Contractions °Contractions of the uterus can occur throughout pregnancy, but they are not always a sign that you are in labor. You may have practice contractions called Braxton Hicks contractions. These false labor contractions are sometimes confused with true labor. °What are Braxton Hicks contractions? °Braxton Hicks contractions are tightening movements that occur in the muscles of the uterus before labor. Unlike true labor contractions, these contractions do not result in opening (dilation) and thinning of the cervix. Toward the end of pregnancy (32-34 weeks), Braxton Hicks contractions can happen more often and may become stronger. These contractions are sometimes difficult to tell apart from true labor because they can be very uncomfortable. You should not feel embarrassed if you go to the hospital with false labor. °Sometimes, the only way to tell if you are in true labor is for your health care provider to look for changes in the cervix. The health care provider will do a physical exam and may monitor your contractions. If you are not in true labor, the exam should show that your cervix is not dilating and your water has not broken. °If there are other health problems associated with your pregnancy, it is completely safe for you to be sent home with false labor. You may continue to have Braxton Hicks contractions until you go into true labor. °How to tell the difference between true labor and false labor °True labor °· Contractions last 30-70 seconds. °· Contractions become very regular. °· Discomfort is usually felt in the top of the uterus, and it spreads to the lower abdomen and low back. °· Contractions do not go away with walking. °· Contractions usually become more intense and increase in frequency. °· The cervix dilates and gets thinner. °False labor °· Contractions are usually shorter and not as strong as true labor contractions. °· Contractions are usually irregular. °· Contractions  are often felt in the front of the lower abdomen and in the groin. °· Contractions may go away when you walk around or change positions while lying down. °· Contractions get weaker and are shorter-lasting as time goes on. °· The cervix usually does not dilate or become thin. °Follow these instructions at home: °· Take over-the-counter and prescription medicines only as told by your health care provider. °· Keep up with your usual exercises and follow other instructions from your health care provider. °· Eat and drink lightly if you think you are going into labor. °· If Braxton Hicks contractions are making you uncomfortable: °? Change your position from lying down or resting to walking, or change from walking to resting. °? Sit and rest in a tub of warm water. °? Drink enough fluid to keep your urine pale yellow. Dehydration may cause these contractions. °? Do slow and deep breathing several times an hour. °· Keep all follow-up prenatal visits as told by your health care provider. This is important. °Contact a health care provider if: °· You have a fever. °· You have continuous pain in your abdomen. °Get help right away if: °· Your contractions become stronger, more regular, and closer together. °· You have fluid leaking or gushing from your vagina. °· You pass blood-tinged mucus (bloody show). °· You have bleeding from your vagina. °· You have low back pain that you never had before. °· You feel your baby’s head pushing down and causing pelvic pressure. °· Your baby is not moving inside you as much as it used to. °Summary °· Contractions that occur before labor are called Braxton   Hicks contractions, false labor, or practice contractions. °· Braxton Hicks contractions are usually shorter, weaker, farther apart, and less regular than true labor contractions. True labor contractions usually become progressively stronger and regular and they become more frequent. °· Manage discomfort from Braxton Hicks contractions by  changing position, resting in a warm bath, drinking plenty of water, or practicing deep breathing. °This information is not intended to replace advice given to you by your health care provider. Make sure you discuss any questions you have with your health care provider. °Document Released: 04/13/2017 Document Revised: 04/13/2017 Document Reviewed: 04/13/2017 °Elsevier Interactive Patient Education © 2018 [employer] Inc[employer] ° °Fetal Movement Counts °Patient Name: ________________________________________________ Patient Due Date: ____________________ °What is a fetal movement count? °A fetal movement count is the number of times that you feel your baby move during a certain amount of time. This may also be called a fetal kick count. A fetal movement count is recommended for every pregnant woman. You may be asked to start counting fetal movements as early as week 28 of your pregnancy. °Pay attention to when your baby is most active. You may notice your baby's sleep and wake cycles. You may also notice things that make your baby move more. You should do a fetal movement count: °· When your baby is normally most active. °· At the same time each day. ° °A good time to count movements is while you are resting, after having something to eat and drink. °How do I count fetal movements? °1. Find a quiet, comfortable area. Sit, or lie down on your side. °2. Write down the date, the start time and stop time, and the number of movements that you felt between those two times. Take this information with you to your health care visits. °3. For 2 hours, count kicks, flutters, swishes, rolls, and jabs. You should feel at least 10 movements during 2 hours. °4. You may stop counting after you have felt 10 movements. °5. If you do not feel 10 movements in 2 hours, have something to eat and drink. Then, keep resting and counting for 1 hour. If you feel at least 4 movements during that hour, you may stop counting. °Contact a health care  provider if: °· You feel fewer than 4 movements in 2 hours. °· Your baby is not moving like he or she usually does. °Date: ____________ Start time: ____________ Stop time: ____________ Movements: ____________ °Date: ____________ Start time: ____________ Stop time: ____________ Movements: ____________ °Date: ____________ Start time: ____________ Stop time: ____________ Movements: ____________ °Date: ____________ Start time: ____________ Stop time: ____________ Movements: ____________ °Date: ____________ Start time: ____________ Stop time: ____________ Movements: ____________ °Date: ____________ Start time: ____________ Stop time: ____________ Movements: ____________ °Date: ____________ Start time: ____________ Stop time: ____________ Movements: ____________ °Date: ____________ Start time: ____________ Stop time: ____________ Movements: ____________ °Date: ____________ Start time: ____________ Stop time: ____________ Movements: ____________ °This information is not intended to replace advice given to you by your health care provider. Make sure you discuss any questions you have with your health care provider. °Document Released: 12/28/2006 Document Revised: 07/27/2016 Document Reviewed: 01/07/2016 °Elsevier Interactive Patient Education © 2018 Elsevier Inc. ° °

## 2018-04-15 NOTE — MAU Note (Signed)
Patient returns to [facility] for worsening contractions.  Rating pain 10/10. Denies LOF or VB.  +FM Endorses being 3cm when seen this am.   Denies any problems with the pregnancy.

## 2018-04-15 NOTE — Anesthesia Procedure Notes (Signed)
Epidural Patient location during procedure: OB Start time: 04/15/2018 7:46 PM End time: 04/15/2018 7:59 PM  Staffing Anesthesiologist: Trevor Iha, MD Performed: anesthesiologist   Preanesthetic Checklist Completed: patient identified, site marked, surgical consent, pre-op evaluation, timeout performed, IV checked, risks and benefits discussed and monitors and equipment checked  Epidural Patient position: sitting Prep: site prepped and draped and DuraPrep Patient monitoring: continuous pulse ox and blood pressure Approach: midline Location: L3-L4 Injection technique: LOR air  Needle:  Needle type: Tuohy  Needle gauge: 17 G Needle length: 9 cm and 9 Needle insertion depth: 5 cm cm Catheter type: closed end flexible Catheter size: 19 Gauge Catheter at skin depth: 10 cm Test dose: negative  Assessment Events: blood not aspirated, injection not painful, no injection resistance, negative IV test and no paresthesia

## 2018-04-15 NOTE — Anesthesia Preprocedure Evaluation (Signed)
Anesthesia Evaluation  Patient identified by MRN, date of birth, ID band Patient awake    Reviewed: Allergy & Precautions, NPO status , Patient's Chart, lab work & pertinent test results  Airway Mallampati: II  TM Distance: >3 FB Neck ROM: Full    Dental no notable dental hx.    Pulmonary neg pulmonary ROS,    Pulmonary exam normal breath sounds clear to auscultation       Cardiovascular Exercise Tolerance: Good negative cardio ROS Normal cardiovascular exam Rhythm:Regular Rate:Normal     Neuro/Psych  Headaches, negative psych ROS   GI/Hepatic negative GI ROS, Neg liver ROS,   Endo/Other  negative endocrine ROS  Renal/GU negative Renal ROS  negative genitourinary   Musculoskeletal negative musculoskeletal ROS (+)   Abdominal   Peds negative pediatric ROS (+)  Hematology negative hematology ROS (+)   Anesthesia Other Findings   Reproductive/Obstetrics (+) Pregnancy                             Lab Results  Component Value Date   WBC 11.7 (H) 04/15/2018   HGB 12.0 04/15/2018   HCT 35.8 (L) 04/15/2018   MCV 89.3 04/15/2018   PLT 232 04/15/2018    Anesthesia Physical Anesthesia Plan  ASA: II  Anesthesia Plan: Epidural   Post-op Pain Management:    Induction:   PONV Risk Score and Plan:   Airway Management Planned:   Additional Equipment:   Intra-op Plan:   Post-operative Plan:   Informed Consent: I have reviewed the patients History and Physical, chart, labs and discussed the procedure including the risks, benefits and alternatives for the proposed anesthesia with the patient or authorized representative who has indicated his/her understanding and acceptance.     Plan Discussed with:   Anesthesia Plan Comments:         Anesthesia Quick Evaluation

## 2018-04-15 NOTE — H&P (Signed)
Carolyn Harrington is a 27 y.o. female, [id number] at 39_2 weeks, presenting for spontaneous labor. Pt present to the MAU for rule out labor.  This pregnancy complicated by GBS+, anemia, and late entry into prenatal . Pt denies leakage of fluid , bleeding, and endorses +FM, and feeling contractions.    Patient Active Problem List   Diagnosis Date Noted  . Indication for care in labor and delivery, antepartum 04/15/2018  . Overweight(278.02) 04/05/2012  . Contraception management 04/05/2012  . Pelvic relaxation 04/05/2012  . Constipation 04/05/2012  . Migraines 11/25/2011    History of present pregnancy: Patient entered care at 15 weeks.   EDC of 04/20/2018 was established by [place] /10/2018.   Anatomy scan:  19_5 weeks, with normal findings and an posterior placenta.     Significant prenatal events: none   Last evaluation:  04/15/2018 seen in MAU for rule out labor, was sent home in early labor.   OB History    Gravida  4   Para  1   Term  1   Preterm  0   AB  2   Living  1     SAB  2   TAB  0   Ectopic  0   Multiple  0   Live Births  1          Past Medical History:  Diagnosis Date  . Headache(784.0)   . Late prenatal care    Past Surgical History:  Procedure Laterality Date  . TONSILLECTOMY     Family History: family history includes Asthma in her cousin; Cancer in her maternal grandmother; Diabetes in her maternal grandmother, paternal aunt, and paternal grandmother; Heart disease in her mother; Hypertension in her maternal aunt and paternal aunt; Migraines in her father. Social History:  reports that she has never smoked. She has never used smokeless tobacco. She reports that she does not drink alcohol or use drugs.   Prenatal Transfer Tool  Maternal Diabetes: No Genetic Screening: Declined Maternal Ultrasounds/Referrals: Normal Fetal Ultrasounds or other Referrals:  None Maternal Substance Abuse:  No Significant Maternal Medications:  None Significant  Maternal Lab Results: Lab values include: Group B Strep positive  TDAP yes Flu No  ROS:  Review of Systems  Constitutional: Negative.   HENT: Negative.   Eyes: Negative.   Respiratory: Negative.   Cardiovascular: Negative.   Gastrointestinal: Positive for abdominal pain.  Genitourinary: Negative.   Musculoskeletal: Negative.   Skin: Negative.   Neurological: Negative.   Endo/Heme/Allergies: Negative.   Psychiatric/Behavioral: Negative.     No Known Allergies   Dilation: 5 Effacement (%): 80 Station: -2 Exam by:: CRoxan Hockey RN Blood pressure 120/67, pulse 79, temperature 98 F (36.7 C), temperature source Oral, resp. rate 18, height  (1.575 m), weight 154 lb 9.6 oz (70.1 kg), SpO2 100 %.  Chest clear Heart RRR without murmur Abd gravid, NT, FH appropriate. Pelvic: Per RN  Ext: no edema   FHR: Category 1, 155, moderate variability, + Acels, -decels.  UCs:  3-4 in 10, moderate to palpate  Prenatal labs: ABO, Rh: AB/Positive/-- (11/08 0000) Antibody:   Rubella:  Immune (11/08 0000) RPR: Nonreactive (11/08 0000)  HBsAg: Negative (11/08 0000)  HIV: Non-reactive (11/08 0000)  GBS: Positive (04/11 0000) Sickle cell/Hgb electrophoresis:  AA Pap:  2017 WNL GC:  neg Chlamydia:  neg Genetic screenings:  declined Glucola:  wnl Other:   Hgb 12.7 at NOB, 10.7 at 28 weeks  Assessment/Plan: IUP at 39_2, EDD 04/20/2018, presented with spontaneous labor with intact membranes, contracting.  Cat 1 Tracing  GBS positive.   Plan: Admit to Birthing Suite Routine [facility] orders Pain med/epidural prn PCN G for GBS prophylaxis Ancipitates Labor progression   Edgar Frisk, MSN 04/15/2018, 7:57 PM

## 2018-04-16 LAB — CBC
HCT: 30.8 % — ABNORMAL LOW (ref 36.0–46.0)
Hemoglobin: 10.4 g/dL — ABNORMAL LOW (ref 12.0–15.0)
MCH: 29.9 pg (ref 26.0–34.0)
MCHC: 33.8 g/dL (ref 30.0–36.0)
MCV: 88.5 fL (ref 78.0–100.0)
PLATELETS: 205 10*3/uL (ref 150–400)
RBC: 3.48 MIL/uL — ABNORMAL LOW (ref 3.87–5.11)
RDW: 13 % (ref 11.5–15.5)
WBC: 13.2 10*3/uL — AB (ref 4.0–10.5)

## 2018-04-16 LAB — RPR: RPR: NONREACTIVE

## 2018-04-16 NOTE — Progress Notes (Signed)
Post Partum Day 1  Subjective: no complaints, up ad lib, voiding, tolerating PO and + flatus  Objective: Blood pressure (!) 91/55, pulse 63, temperature 97.9 F (36.6 C), resp. rate 16, height  (1.575 m), weight 154 lb 9.6 oz (70.1 kg), SpO2 99 %, unknown if currently breastfeeding.   Vitals:   04/16/18 0019 04/16/18 0455  BP: 102/63 (!) 91/55  Pulse: 75 63  Resp: 18 16  Temp: 98.4 F (36.9 C) 97.9 F (36.6 C)  SpO2: 99%    Physical Exam:  General: alert and cooperative Lochia: appropriate Uterine Fundus: firm Incision: n/a DVT Evaluation: No evidence of DVT seen on physical exam. Negative Homan's sign. No cords or calf tenderness. No significant calf/ankle edema.  Recent Labs    04/15/18 1848 04/16/18 0532  HGB 12.0 10.4*  HCT 35.8* 30.8*    Assessment/Plan: Plan for discharge tomorrow  Inadequate treatment for GBS Continue current orders   LOS: 1 day   [employer] , CNM 04/16/2018, 9:50 AM

## 2018-04-16 NOTE — Anesthesia Postprocedure Evaluation (Signed)
Anesthesia Post Note  Patient: Carolyn Harrington  Procedure(s) Performed: AN AD HOC LABOR EPIDURAL     Patient location during evaluation: Mother Baby Anesthesia Type: Epidural Level of consciousness: awake, awake and alert and oriented Pain management: pain level controlled Vital Signs Assessment: post-procedure vital signs reviewed and stable Respiratory status: spontaneous breathing, nonlabored ventilation and respiratory function stable Cardiovascular status: stable Postop Assessment: no headache, no backache, patient able to bend at knees, no apparent nausea or vomiting, adequate PO intake and able to ambulate Anesthetic complications: no    Last Vitals:  Vitals:   04/16/18 0019 04/16/18 0455  BP: 102/63 (!) 91/55  Pulse: 75 63  Resp: 18 16  Temp: 36.9 C 36.6 C  SpO2: 99%     Last Pain:  Vitals:   04/16/18 0455  TempSrc:   PainSc: 0-No pain   Pain Goal: Patients Stated Pain Goal: 3 (04/15/18 1828)               Truitt Leep

## 2018-04-17 LAB — BIRTH TISSUE RECOVERY COLLECTION (PLACENTA DONATION)

## 2018-04-17 MED ORDER — IBUPROFEN 600 MG PO TABS: 600.0000 mg | ORAL_TABLET | Freq: Four times a day (QID) | ORAL | 0 refills | Status: DC

## 2018-04-17 NOTE — Discharge Summary (Signed)
OB Discharge Summary     Patient Name: Carolyn Harrington DOB: [birth year] MRN: [id number]  Date of admission: 04/15/2018 Delivering MD: Kenney Houseman   Date of discharge: 04/17/2018  Admitting diagnosis: 39 WKS, CTXS Intrauterine pregnancy: [redacted]w[redacted]d     Secondary diagnosis:  Active Problems:   SVD (spontaneous vaginal delivery)   Indication for care in labor and delivery, antepartum  Additional problems: GBS+ untreated.      Discharge diagnosis: Term Pregnancy Delivered                                                                                                Post partum procedures:none  Augmentation: none  Complications: None  Hospital course:  Onset of Labor With Vaginal Delivery     27 y.o. yo [id number] at [redacted]w[redacted]d was admitted in Active Labor on 04/15/2018. Patient had an uncomplicated labor course as follows:  Membrane Rupture Time/Date: 8:42 PM ,04/15/2018   Intrapartum Procedures: Episiotomy: None [1]                                         Lacerations:  1st degree [2]  Patient had a delivery of a Viable infant. 04/15/2018  Information for the patient's newborn:  Asuna, Peth Girl Abby [[id number]]  Delivery Method: Vag-Spont    Pateint had an uncomplicated postpartum course.  She is ambulating, tolerating a regular diet, passing flatus, and urinating well. Patient is discharged home in stable condition on 04/17/18.   Physical exam  Vitals:   04/16/18 0455 04/16/18 1300 04/16/18 1724 04/17/18 0528  BP: (!) 91/55 96/63 105/75 102/63  Pulse: 63 72 73 61  Resp: Temp: 97.9 F (36.6 C) 98 F (36.7 C) 97.8 F (36.6 C) 97.8 F (36.6 C)  TempSrc:  Oral Oral Oral  SpO2:      Weight:      Height:       General: alert, cooperative and no distress Lochia: appropriate Uterine Fundus: firm Incision: Healing well with no significant drainage DVT Evaluation: No evidence of DVT seen on physical exam. Negative Homan's sign. No cords or calf tenderness. Labs: Lab  Results  Component Value Date   WBC 13.2 (H) 04/16/2018   HGB 10.4 (L) 04/16/2018   HCT 30.8 (L) 04/16/2018   MCV 88.5 04/16/2018   PLT 205 04/16/2018   CMP Latest Ref Rng & Units 11/11/2015  Glucose 65 - 99 mg/dL 284(X)  BUN 6 - 20 mg/dL 7  Creatinine 3.24 - 4.01 mg/dL 0.27  Sodium 253 - 664 mmol/L 137  Potassium 3.5 - 5.1 mmol/L 3.9  Chloride 101 - 111 mmol/L 106  CO2 22 - 32 mmol/L 22  Calcium 8.9 - 10.3 mg/dL 8.9  Total Protein 6.5 - 8.1 g/dL 7.4  Total Bilirubin 0.3 - 1.2 mg/dL 0.4  Alkaline Phos 38 - 126 U/L 69  AST 15 - 41 U/L 22  ALT 14 - 54 U/L 15    Discharge instruction: per After Visit Summary  and "Baby and Me Booklet".  After visit meds:  Allergies as of 04/17/2018   No Known Allergies     Medication List    STOP taking these medications   terconazole 0.4 % vaginal cream Commonly known as:  TERAZOL 7     TAKE these medications   acetaminophen 500 MG tablet Commonly known as:  TYLENOL Take 500 mg by mouth every 6 (six) hours as needed for headache.   Butalbital-APAP-Caffeine 50-300-40 MG Caps TAKE 1 TABLET BY MOUTH EVERY 12 HOURS AS NEEDED FOR HEADACHE   ibuprofen 600 MG tablet Commonly known as:  ADVIL,MOTRIN Take 1 tablet (600 mg total) by mouth every 6 (six) hours.   ranitidine 150 MG tablet Commonly known as:  ZANTAC Take 150 mg by mouth daily as needed for heartburn.       Diet: routine diet  Activity: Advance as tolerated. Pelvic rest for 6 weeks.   Outpatient follow up:6 weeks Follow up Appt:No future appointments. Follow up Visit:No follow-ups on file.  Postpartum contraception: Not Discussed  Newborn Data: Live born female  Birth Weight: 6 lb 6.5 oz (2905 g) APGAR: 8, 9  Newborn Delivery   Birth date/time:  04/15/2018 21:16:00 Delivery type:  Vaginal, Spontaneous     Baby Feeding: Bottle Disposition:rooming in Pt discharge in stable condition   04/17/2018 Dale Lincoln, FNP

## 2021-12-21 ENCOUNTER — Encounter (HOSPITAL_COMMUNITY): Payer: Self-pay | Admitting: Emergency Medicine

## 2021-12-21 ENCOUNTER — Other Ambulatory Visit: Payer: Self-pay

## 2021-12-21 ENCOUNTER — Emergency Department (HOSPITAL_COMMUNITY)
Admission: EM | Admit: 2021-12-21 | Discharge: 2021-12-21 | Disposition: A | Discharge status: Home / Self Care | Payer: Medicaid Other | Attending: Emergency Medicine | Admitting: Emergency Medicine

## 2021-12-21 DIAGNOSIS — H9311 Tinnitus, right ear: Secondary | ICD-10-CM

## 2021-12-21 MED ORDER — MECLIZINE HCL 12.5 MG PO TABS: 12.5000 mg | ORAL_TABLET | Freq: Three times a day (TID) | ORAL | 0 refills | Status: DC | PRN

## 2021-12-21 NOTE — ED Provider Triage Note (Signed)
Emergency Medicine Provider Triage Evaluation Note  Carolyn Harrington , a 31 y.o. female  was evaluated in triage.  Pt complains of ear ache.  Reports associated tinnitus.  Symptoms worsened after pouring peroxide in her ear.  Denies pain with manipulation of the ear.  Denies fevers.  Review of Systems  Positive: Otalgia, tinnitus Negative: Fever, chills  Physical Exam  BP 130/73 (BP Location: Left Arm)    Pulse 63    Temp 98.1 F (36.7 C)    Resp 15    SpO2 100%  Gen:   Awake, no distress   Resp:  Normal effort  MSK:   Moves extremities without difficulty  Other:  Unable to visualize TM due to cerumen impaction  Medical Decision Making  Medically screening exam initiated at 5:00 AM.  Appropriate orders placed.  Carolyn Harrington was informed that the remainder of the evaluation will be completed by another provider, this initial triage assessment does not replace that evaluation, and the importance of remaining in the ED until their evaluation is complete.  Patient here with earache.  Unable to visualize the TM.  Pt states that she is willing to wait until we have chance to clean out her ears.  Gerarda Fraction, PA-C 12/21/21 [phone]

## 2021-12-21 NOTE — Discharge Instructions (Addendum)
Return for any problem.  ?

## 2021-12-21 NOTE — ED Provider Notes (Signed)
[facility] EMERGENCY DEPARTMENT Provider Note   CSN: [id number] Arrival date & time: 12/21/21  0409     History  Chief Complaint  Patient presents with   Tinnitus    Carolyn Harrington is a 31 y.o. female.  31 year old female with prior medical history as detailed below presents for evaluation.  Patient reports that she believes she got water into her right ear.  This occurred during a shower.  Since then she has had a "crunchy" sound in her right ear.  The sound is intermittent.  She did have occasional vertiginous symptoms.  She denies current vertiginous symptoms.  She denies pain.  She denies fever.  She admits that she became anxious about her symptoms and desired evaluation.  The history is provided by the patient.  Illness Location:  "water" Severity:  Mild Onset quality:  Gradual Duration:  2 days Timing:  Constant Progression:  Waxing and waning Chronicity:  New     Home Medications Prior to Admission medications   Medication Sig Start Date End Date Taking? Authorizing Provider  meclizine (ANTIVERT) 12.5 MG tablet Take 1 tablet (12.5 mg total) by mouth 3 (three) times daily as needed for dizziness. 12/21/21  Yes Wynetta Fines, MD  acetaminophen (TYLENOL) 500 MG tablet Take 500 mg by mouth every 6 (six) hours as needed for headache.     [provider]  Butalbital-APAP-Caffeine 50-300-40 MG CAPS TAKE 1 TABLET BY MOUTH EVERY 12 HOURS AS NEEDED FOR HEADACHE 02/13/18   [provider]  ibuprofen (ADVIL,MOTRIN) 600 MG tablet Take 1 tablet (600 mg total) by mouth every 6 (six) hours. 04/17/18   Dale , FNP  ranitidine (ZANTAC) 150 MG tablet Take 150 mg by mouth daily as needed for heartburn.    [provider]      Allergies    Patient has no known allergies.    Review of Systems   Review of Systems  All other systems reviewed and are negative.  Physical Exam Updated Vital Signs BP 121/77 (BP Location: Left Arm)     Pulse 62    Temp 98.1 F (36.7 C) (Oral)    Resp 20    SpO2 100%  Physical Exam Vitals and nursing note reviewed.  Constitutional:      General: She is not in acute distress.    Appearance: Normal appearance. She is well-developed.  HENT:     Head: Normocephalic and atraumatic.  Eyes:     Conjunctiva/sclera: Conjunctivae normal.     Pupils: Pupils are equal, round, and reactive to light.  Cardiovascular:     Rate and Rhythm: Normal rate and regular rhythm.     Heart sounds: Normal heart sounds.  Pulmonary:     Effort: Pulmonary effort is normal. No respiratory distress.     Breath sounds: Normal breath sounds.  Abdominal:     General: There is no distension.     Palpations: Abdomen is soft.     Tenderness: There is no abdominal tenderness.  Musculoskeletal:        General: No deformity. Normal range of motion.     Cervical back: Normal range of motion and neck supple.  Skin:    General: Skin is warm and dry.  Neurological:     General: No focal deficit present.     Mental Status: She is alert and oriented to person, place, and time.    ED Results / Procedures / Treatments   Labs (all labs ordered are  listed, but only abnormal results are displayed) Labs Reviewed - No data to display  EKG None  Radiology No results found.  Procedures Procedures    Medications Ordered in ED Medications - No data to display  ED Course/ Medical Decision Making/ A&P                           Medical Decision Making   Medical Screen Complete  This patient presented to the ED with complaint of ear "ringing."  This complaint involves an extensive number of treatment options. The initial differential diagnosis includes, but is not limited to, otitis externa, otitis media, cerumen impaction, etc.  This presentation is: Acute, Self-Limited, and Previously Undiagnosed  Patient reports that she got some water in her right ear during her shower 2 days prior.  This was associated with  some mild tinnitus and "crunching" sounds. She attempted to treat her symptoms with use of hydrogen peroxide applied into the ear.  She experience a mild intermittent vertiginous symptoms with her ear complaint.  She appears comfortable on exam today.  There is no evidence on exam of otitis, trauma, or other significant acute pathology.  Patient reassured by exam.  She does understand need for close follow-up.  Strict return precautions given and understood.     Additional history obtained:   External records from outside sources obtained and reviewed including prior ED visits and prior Inpatient records.       Problem List / ED Course:  Tinnitus, cerumen    Reevaluation:  After the interventions noted above, I reevaluated the patient and found that they have: improved    Dispostion:  After consideration of the diagnostic results and the patients response to treatment, I feel that the patent would benefit from close outpatient follow-up.          Final Clinical Impression(s) / ED Diagnoses Final diagnoses:  Tinnitus of right ear    Rx / DC Orders ED Discharge Orders          Ordered    meclizine (ANTIVERT) 12.5 MG tablet  3 times daily PRN        12/21/21 1309              Wynetta Fines, MD 12/21/21 1316

## 2021-12-21 NOTE — ED Triage Notes (Signed)
Patient with a few days of ear ringing.  Patient states that she got some water in her right ear.

## 2022-09-12 ENCOUNTER — Ambulatory Visit
Admission: RE | Admit: 2022-09-12 | Discharge: 2022-09-12 | Disposition: A | Discharge status: Home / Self Care | Payer: Medicaid Other | Source: Ambulatory Visit | Attending: Nurse Practitioner | Admitting: Nurse Practitioner

## 2022-09-12 ENCOUNTER — Other Ambulatory Visit: Payer: Self-pay | Admitting: Nurse Practitioner

## 2022-09-12 DIAGNOSIS — M545 Low back pain, unspecified: Secondary | ICD-10-CM

## 2022-09-12 DIAGNOSIS — M542 Cervicalgia: Secondary | ICD-10-CM

## 2022-09-22 ENCOUNTER — Ambulatory Visit: Payer: Medicaid Other | Attending: Nurse Practitioner | Admitting: Physical Therapy

## 2022-09-22 ENCOUNTER — Encounter: Payer: Self-pay | Admitting: Physical Therapy

## 2022-09-22 DIAGNOSIS — M542 Cervicalgia: Secondary | ICD-10-CM | POA: Diagnosis present

## 2022-09-22 DIAGNOSIS — M5459 Other low back pain: Secondary | ICD-10-CM | POA: Insufficient documentation

## 2022-09-22 DIAGNOSIS — M25552 Pain in left hip: Secondary | ICD-10-CM | POA: Diagnosis present

## 2022-09-22 NOTE — Therapy (Signed)
OUTPATIENT PHYSICAL THERAPY LUMBAR/CERVICAL EVALUATION   Patient Name: Carolyn Harrington MRN: [id number] DOB:09-Jun-1991, 31 y.o., female Today's Date: 09/22/2022   PT End of Session - 09/22/22 0858     Visit Number 1    Number of Visits 6    Date for PT Re-Evaluation 11/03/22    Authorization Type [place] [employer]    PT Start Time [phone]    PT Stop Time 0935    PT Time Calculation (min) 42 min    Activity Tolerance Patient tolerated treatment well    Behavior During Therapy [facility] for tasks assessed/performed             Past Medical History:  Diagnosis Date   Headache(784.0)    Late prenatal care    Past Surgical History:  Procedure Laterality Date   TONSILLECTOMY     Patient Active Problem List   Diagnosis Date Noted   Indication for care in labor and delivery, antepartum 04/15/2018   Overweight(278.02) 04/05/2012   Contraception management 04/05/2012   Pelvic relaxation 04/05/2012   Constipation 04/05/2012   Migraines 11/25/2011   SVD (spontaneous vaginal delivery) 11/25/2011    PCP: Mila Palmer, MD  REFERRING PROVIDER: America Brown FNP pain   REFERRING DIAG: M54.2 (ICD-10-CM) - Cervicalgia   THERAPY DIAG:  Other low back pain  Pain in left hip  Rationale for Evaluation and Treatment Rehabilitation  ONSET DATE: Jun 2022  SUBJECTIVE:                                                                                                                                                                                                         SUBJECTIVE STATEMENT: Patient referred to PT from Pain clinic for neck and back pain .  "the pain is my whole left side, hip, back, neck and L arm.  It pops and clicks all the time.  Its a "fullness" or like there's something in there. L hip feels better when it pops.  I feel like if I could just get that one move it would put things back into place"  Patient reports she felt a shooting pain in her shoulder and neck about a year  ago.  She had some hip and back pain as well at that time. She was also running a lot, maybe doing too much.   She "gave up" on working out. She denies weakness but it feels hard to go deep into a squat. She pushes through a lot of pain in her daily life. She has difficulty lifting, carrying heavy items, including her 33  yr old.   PERTINENT HISTORY:  Headaches are chronic   PAIN:  Are you having pain? Yes: NPRS scale: 5/10 Pain location: L neck, back , headache coming on  Pain description: dull ache, neck stiffness Aggravating factors: driving, sitting Relieving factors: BC powder (headaches), stretching   PRECAUTIONS: None  WEIGHT BEARING RESTRICTIONS No  FALLS:  Has patient fallen in last 6 months? No  LIVING ENVIRONMENT: Lives with: lives with their family, 87, 40 yr old , mom and brother  Lives in: House/apartment Stairs: No Has following equipment at home: None  OCCUPATION: She is not working  PLOF: Independent and Leisure: reading, podcast   PATIENT GOALS : I want some pain relief.  Wants to do a split  OBJECTIVE:   DIAGNOSTIC FINDINGS:  XR [place] imaging Cervical loss of lordosis Normal Lumbar   PATIENT SURVEYS:  NT , given wrong body part survey   COGNITION: Overall cognitive status: Within functional limits for tasks assessed   SENSATION: L ant lateral thigh numb   POSTURE: increased lumbar lordosis  PALPATION: Pain with palpation in prone along L mid thoracic to L5 and gluteals   CERVICAL ROM:  WNL with no pain   UPPER EXTREMITY ROM:   WNL no pain   UPPER EXTREMITY MMT:  MMT Right eval Left eval  Shoulder flexion 4 4  Shoulder extension    Shoulder abduction 4 4  Shoulder adduction    Shoulder extension    Shoulder internal rotation 5 5  Shoulder external rotation 5 5  Middle trapezius    Lower trapezius    Elbow flexion    Elbow extension    Grip strength     (Blank rows = not tested)  CERVICAL SPECIAL TESTS:  Spurling's test:  NT  Lumbar AROM Flexion : pain, 25 % limited Extension: WFL , central discomfort  Rt SB pain along L side  Lt. SB WNL  Rotation WNL   LE MMT: Hip flexion 4/5 Hip abd Rt 3+/5, Lt 5/5 Hip extension Rt 4/5, Lt. 5/5 Knees 5/5 bilateral , ankle DF 5/5   FUNCTIONAL TESTS:  NT    TODAY'S TREATMENT:  Eval of neck and back HEP POC established  Postural imbalance Role of core    PATIENT EDUCATION:  Education details: see above  Person educated: Patient Education method: Programmer, multimedia, Demonstration, Verbal cues, and Handouts Education comprehension: verbalized understanding and returned demonstration   HOME EXERCISE PROGRAM: Access Code: [email or web address] URL: [email or web address] Date: 09/22/2022 Prepared by: Karie Mainland  Exercises - Supine Lower Trunk Rotation  - 2 x daily - 7 x weekly - 2 sets - 10 reps - Supine Single Knee to Chest Stretch  - 2 x daily - 7 x weekly - 1 sets - 3-5 reps - 30 hold - Cat Cow  - 2 x daily - 7 x weekly - 2 sets - 10 reps - 10 hold - Quadruped Pelvic Floor Contraction with Opposite Arm and Leg Lift  - 1 x daily - 7 x weekly - 2 sets - 10 reps - 5 hold ASSESSMENT:  CLINICAL IMPRESSION: Patient is a 31 y.o. female who was seen today for physical therapy evaluation and treatment for neck and low back pain. She also complains of L pelvic pain. Patient really did not complain of neck pain so we focused on lower body.  She "pops" her L shoulder and mid/low back during session which reduces her pain.  She had some anxiety on examination , fear of pain provocation in  hip PROM and needed reassurance.  She will benefit from skilled PT for core strength , stability and education on lifting, body mechanics.    OBJECTIVE IMPAIRMENTS decreased mobility, decreased ROM, decreased strength, increased fascial restrictions, postural dysfunction, and pain.   ACTIVITY LIMITATIONS carrying, lifting, bending, sitting, standing, squatting, and locomotion  level  PARTICIPATION LIMITATIONS: cleaning, interpersonal relationship, community activity, and recreation, fitness   PERSONAL FACTORS Time since onset of injury/illness/exacerbation are also affecting patient's functional outcome.   REHAB POTENTIAL: Excellent  CLINICAL DECISION MAKING: Stable/uncomplicated  EVALUATION COMPLEXITY: Low   GOALS:  LONG TERM GOALS: Target date: 11/03/2022  Pt will be I with final HEP  Baseline: given on evaluation  Goal status: INITIAL  2.  Pt will be able to report pain in low back, hips as < 3/10 with ADLs and light activity most days of the week Baseline: 4/10-6/10 Goal status: INITIAL  3.  Pt will be able to return to gym routine, modified if needed for safety and form Baseline: has stopped altogether  Goal status: INITIAL  4.  Pt will be able to squat and lift items (up to 25 lbs) from the floor without increased back pain  Baseline: fearful, pain in back  Goal status: INITIAL    PLAN: PT FREQUENCY: 1x/week PER REFERRAL: DO NOT SCHEDULE MORE THAN 6 VISITS. Return to [facility] .    PT DURATION: 6 weeks  PLANNED INTERVENTIONS: Therapeutic exercises, Therapeutic activity, Neuromuscular re-education, Balance training, Gait training, Patient/Family education, Self Care, Joint mobilization, Joint manipulation, Dry Needling, Spinal manipulation, Spinal mobilization, Cryotherapy, Moist heat, Manual therapy, and Re-evaluation  PLAN FOR NEXT SESSION: HEP and progress . Loading when ready. Consider T-L manipulation.    Ashlyn Cabler, PT 09/22/2022, 9:53 AM   Raeford Razor, PT 09/22/22 10:14 AM Phone: [phone] Fax: [phone]

## 2022-09-30 ENCOUNTER — Encounter: Payer: Self-pay | Admitting: Physical Therapy

## 2022-09-30 ENCOUNTER — Ambulatory Visit: Payer: Medicaid Other | Admitting: Physical Therapy

## 2022-09-30 DIAGNOSIS — M542 Cervicalgia: Secondary | ICD-10-CM

## 2022-09-30 DIAGNOSIS — M25552 Pain in left hip: Secondary | ICD-10-CM

## 2022-09-30 DIAGNOSIS — M5459 Other low back pain: Secondary | ICD-10-CM | POA: Diagnosis not present

## 2022-09-30 NOTE — Therapy (Signed)
OUTPATIENT PHYSICAL THERAPY TREATMENT NOTE   Patient Name: Carolyn Harrington MRN: [id number] DOB:08/16/1991, 31 y.o., female Today's Date: 09/30/2022  PCP: Dr. Jonathon Jordan REFERRING PROVIDER: Jorene Guest, FNP   END OF SESSION:   PT End of Session - 09/30/22 0853     Visit Number 2    Number of Visits 6    Date for PT Re-Evaluation 11/03/22    Authorization Type [place] [employer]    PT Start Time 0850    PT Stop Time 0929    PT Time Calculation (min) 39 min    Activity Tolerance Patient tolerated treatment well    Behavior During Therapy [facility] for tasks assessed/performed             Past Medical History:  Diagnosis Date   Headache(784.0)    Late prenatal care    Past Surgical History:  Procedure Laterality Date   TONSILLECTOMY     Patient Active Problem List   Diagnosis Date Noted   Indication for care in labor and delivery, antepartum 04/15/2018   Overweight(278.02) 04/05/2012   Contraception management 04/05/2012   Pelvic relaxation 04/05/2012   Constipation 04/05/2012   Migraines 11/25/2011   SVD (spontaneous vaginal delivery) 11/25/2011    REFERRING DIAG: M54.2 (ICD-10-CM) - Cervicalgia   THERAPY DIAG:  Other low back pain  Pain in left hip  Cervicalgia  Rationale for Evaluation and Treatment Rehabilitation  PERTINENT HISTORY: see above   PRECAUTIONS: none   SUBJECTIVE:                                                                                                                                                                                      SUBJECTIVE STATEMENT:  I have been feeling better, I needed the hip circles.  Pain is actually pelvis.     PAIN:  Are you having pain? Yes: NPRS scale: 6/10 Pain location: pelvis  Pain description: tight , fullness Aggravating factors: sitting long time  Relieving factors: gentle movement  Yes: NPRS scale: 4/10 Pain location: neck L , jaw  Pain description: tight  Aggravating factors: laying  wrong  Relieving factors: stretching it  OBJECTIVE: (objective measures completed at initial evaluation unless otherwise dated)  DIAGNOSTIC FINDINGS:  XR [place] imaging Cervical loss of lordosis Normal Lumbar    PATIENT SURVEYS:  NT , given wrong body part survey     COGNITION: Overall cognitive status: Within functional limits for tasks assessed     SENSATION: L ant lateral thigh numb    POSTURE: increased lumbar lordosis   PALPATION: Pain with palpation in prone along L mid thoracic to L5 and gluteals  CERVICAL ROM:  WNL with no pain    UPPER EXTREMITY ROM:             WNL no pain    UPPER EXTREMITY MMT:   MMT Right eval Left eval  Shoulder flexion 4 4  Shoulder extension      Shoulder abduction 4 4  Shoulder adduction      Shoulder extension      Shoulder internal rotation 5 5  Shoulder external rotation 5 5  Middle trapezius      Lower trapezius      Elbow flexion      Elbow extension      Grip strength       (Blank rows = not tested)   CERVICAL SPECIAL TESTS:  Spurling's test: NT   Lumbar AROM Flexion : pain, 25 % limited Extension: WFL , central discomfort  Rt SB pain along L side  Lt. SB WNL  Rotation WNL    LE MMT: Hip flexion 4/5 Hip abd Rt 3+/5, Lt 5/5 Hip extension Rt 4/5, Lt. 5/5 Knees 5/5 bilateral , ankle DF 5/5    FUNCTIONAL TESTS:  NT      TODAY'S TREATMENT:  Eval of neck and back HEP POC established  Postural imbalance Role of core    [facility] Adult PT Treatment:                                                DATE: 09/30/22 Therapeutic Exercise: LTR knees together x 10  knees apart x 10  Knee to chest  x 2 each side  Cat/camel  x 5 Bird dog x 5  Supine a/p tilt over soft ball with exhale/PPT Supine march and single leg bent knee fall out x 10 each (ball under pelvis)  Sit to stand 15 lbs KB x 10 Squat 15 lb KB, less knee pain with slight hip ER Dead lift 15 lbs KB x 15 , min cues for reducing lumbar hyperext.   Piriformis stretch 30 sec x 2  Manual Therapy: IASTM and manual soft tissue mobilization to bilateral SIJ, glutes    PATIENT EDUCATION:  Education details: see above  Person educated: Patient Education method: Explanation, Demonstration, Verbal cues, and Handouts Education comprehension: verbalized understanding and returned demonstration     HOME EXERCISE PROGRAM: Access Code: [email or web address] URL: [email or web address] Date: 09/22/2022 Prepared by: Karie Mainland   Exercises - Supine Lower Trunk Rotation  - 2 x daily - 7 x weekly - 2 sets - 10 reps - Supine Single Knee to Chest Stretch  - 2 x daily - 7 x weekly - 1 sets - 3-5 reps - 30 hold - Cat Cow  - 2 x daily - 7 x weekly - 2 sets - 10 reps - 10 hold - Quadruped Pelvic Floor Contraction with Opposite Arm and Leg Lift  - 1 x daily - 7 x weekly - 2 sets - 10 reps - 5 hold   ASSESSMENT:   CLINICAL IMPRESSION: Patient able to complete therapeutic exercise today with excellent form and cues.  She had less neck pain even though we did not address it today, directly.  She shows instability, hypermobility in spine and pelvis, is benefiting from stability exercises.    OBJECTIVE IMPAIRMENTS decreased mobility, decreased ROM, decreased strength, increased fascial restrictions, postural dysfunction, and pain.    ACTIVITY  LIMITATIONS carrying, lifting, bending, sitting, standing, squatting, and locomotion level   PARTICIPATION LIMITATIONS: cleaning, interpersonal relationship, community activity, and recreation, fitness    PERSONAL FACTORS Time since onset of injury/illness/exacerbation are also affecting patient's functional outcome.    REHAB POTENTIAL: Excellent   CLINICAL DECISION MAKING: Stable/uncomplicated   EVALUATION COMPLEXITY: Low     GOALS:   LONG TERM GOALS: Target date: 11/03/2022   Pt will be I with final HEP  Baseline: given on evaluation  Goal status: INITIAL   2.  Pt will be able to report pain in  low back, hips as < 3/10 with ADLs and light activity most days of the week Baseline: 4/10-6/10 Goal status: INITIAL   3.  Pt will be able to return to gym routine, modified if needed for safety and form Baseline: has stopped altogether  Goal status: INITIAL   4.  Pt will be able to squat and lift items (up to 25 lbs) from the floor without increased back pain  Baseline: fearful, pain in back  Goal status: INITIAL       PLAN: PT FREQUENCY: 1x/week PER REFERRAL: DO NOT SCHEDULE MORE THAN 6 VISITS. Return to [facility] .      PT DURATION: 6 weeks   PLANNED INTERVENTIONS: Therapeutic exercises, Therapeutic activity, Neuromuscular re-education, Balance training, Gait training, Patient/Family education, Self Care, Joint mobilization, Joint manipulation, Dry Needling, Spinal manipulation, Spinal mobilization, Cryotherapy, Moist heat, Manual therapy, and Re-evaluation   PLAN FOR NEXT SESSION: HEP and progress . Loading when ready. Consider T-L manipulation.     Otha Monical, PT 09/30/2022, 9:50 AM    Karie Mainland, PT 09/30/22 9:52 AM Phone: [phone] Fax: [phone]

## 2022-10-05 NOTE — Therapy (Addendum)
OUTPATIENT PHYSICAL THERAPY TREATMENT NOTE DISCHARGE   Patient Name: Carolyn Harrington MRN: [id number] DOB:1991-08-13, 31 y.o., female Today's Date: 10/06/2022  PCP: Dr. Jonathon Jordan REFERRING PROVIDER: Jorene Guest, FNP   END OF SESSION:   PT End of Session - 10/06/22 0851     Visit Number 3    Number of Visits 6    Date for PT Re-Evaluation 11/03/22    Authorization Type [place] Medicaid    PT Start Time 0848    PT Stop Time 0928    PT Time Calculation (min) 40 min              Past Medical History:  Diagnosis Date   Headache(784.0)    Late prenatal care    Past Surgical History:  Procedure Laterality Date   TONSILLECTOMY     Patient Active Problem List   Diagnosis Date Noted   Indication for care in labor and delivery, antepartum 04/15/2018   Overweight(278.02) 04/05/2012   Contraception management 04/05/2012   Pelvic relaxation 04/05/2012   Constipation 04/05/2012   Migraines 11/25/2011   SVD (spontaneous vaginal delivery) 11/25/2011    REFERRING DIAG: M54.2 (ICD-10-CM) - Cervicalgia   THERAPY DIAG:  Other low back pain  Pain in left hip  Cervicalgia  Rationale for Evaluation and Treatment Rehabilitation  PERTINENT HISTORY: see above   PRECAUTIONS: none   SUBJECTIVE:                                                                                                                                                                                      SUBJECTIVE STATEMENT:  Neck is better.  I was able to manipulate my back last night and get rid of the soreness.  I still need to strengthen though.     PAIN:  Are you having pain? Yes: NPRS scale: 3/10 Pain location: pelvis, back  Pain description: tight , fullness Aggravating factors: sitting long time  Relieving factors: gentle movement  Yes: NPRS scale: 0/10 Pain location: neck L , jaw  Pain description: tight  Aggravating factors: laying wrong  Relieving factors: stretching it   OBJECTIVE: (objective measures completed at initial evaluation unless otherwise dated)  DIAGNOSTIC FINDINGS:  XR [place] imaging Cervical loss of lordosis Normal Lumbar    PATIENT SURVEYS:  NT , given wrong body part survey     COGNITION: Overall cognitive status: Within functional limits for tasks assessed     SENSATION: L ant lateral thigh numb    POSTURE: increased lumbar lordosis   PALPATION: Pain with palpation in prone along L mid thoracic to L5 and gluteals     CERVICAL ROM:  WNL with  no pain    UPPER EXTREMITY ROM:             WNL no pain    UPPER EXTREMITY MMT:   MMT Right eval Left eval  Shoulder flexion 4 4  Shoulder extension      Shoulder abduction 4 4  Shoulder adduction      Shoulder extension      Shoulder internal rotation 5 5  Shoulder external rotation 5 5  Middle trapezius      Lower trapezius      Elbow flexion      Elbow extension      Grip strength       (Blank rows = not tested)   CERVICAL SPECIAL TESTS:  Spurling's test: NT   Lumbar AROM Flexion : pain, 25 % limited Extension: WFL , central discomfort  Rt SB pain along L side  Lt. SB WNL  Rotation WNL    LE MMT: Hip flexion 4/5 Hip abd Rt 3+/5, Lt 5/5 Hip extension Rt 4/5, Lt. 5/5 Knees 5/5 bilateral , ankle DF 5/5    FUNCTIONAL TESTS:  NT      TODAY'S TREATMENT:  Eval of neck and back HEP POC established  Postural imbalance Role of core    [facility] Adult PT Treatment:                                                DATE: 10/06/22 Therapeutic Exercise: Elliptical level 5 ramp, Level 8 resistance for 5 min  Squat KB 15 lbs  RDL KB 15 lbs  Stagger stance RDL 15 lb each side Single leg RDL at counter top bilateral UEs light UE support  No UE support single leg RDL with 15 lbs  Wide stance lateral lunge slow, no wgt then 15 lbs KB Supine rotation, self mobilization  Rt hip ER (knee press/knee pull)  Supine 90/90 hold added toe tap x 10 Supine 9090 knee etxension   Bridging (articulation) x 10    [facility] Adult PT Treatment:                                                DATE: 09/30/22 Therapeutic Exercise: LTR knees together x 10  knees apart x 10  Knee to chest  x 2 each side  Cat/camel  x 5 Bird dog x 5  Supine a/p tilt over soft ball with exhale/PPT Supine march and single leg bent knee fall out x 10 each (ball under pelvis)  Sit to stand 15 lbs KB x 10 Squat 15 lb KB, less knee pain with slight hip ER Dead lift 15 lbs KB x 15 , min cues for reducing lumbar hyperext.  Piriformis stretch 30 sec x 2  Manual Therapy: IASTM and manual soft tissue mobilization to bilateral SIJ, glutes    PATIENT EDUCATION:  Education details: see above  Person educated: Patient Education method: Explanation, Demonstration, Verbal cues, and Handouts Education comprehension: verbalized understanding and returned demonstration     HOME EXERCISE PROGRAM: Access Code: [email or web address] URL: [email or web address] Date: 09/22/2022 Prepared by: Raeford Razor  ASSESSMENT:   CLINICAL IMPRESSION: Patient able to perform strength and stability exercises with good form and cue correction.  Pt had an increased  pain in Rt post hip and pelvis, 5/10-6/10, began to burn a bit. She was able to manipulate her body to reduce the symptoms.  Given HEP to address stability in standing.  She had no pain as she walked out of the clinic.    OBJECTIVE IMPAIRMENTS decreased mobility, decreased ROM, decreased strength, increased fascial restrictions, postural dysfunction, and pain.    ACTIVITY LIMITATIONS carrying, lifting, bending, sitting, standing, squatting, and locomotion level   PARTICIPATION LIMITATIONS: cleaning, interpersonal relationship, community activity, and recreation, fitness    PERSONAL FACTORS Time since onset of injury/illness/exacerbation are also affecting patient's functional outcome.    REHAB POTENTIAL: Excellent   CLINICAL DECISION MAKING:  Stable/uncomplicated   EVALUATION COMPLEXITY: Low     GOALS:   LONG TERM GOALS: Target date: 11/03/2022   Pt will be I with final HEP  Baseline: given on evaluation  Goal status: INITIAL   2.  Pt will be able to report pain in low back, hips as < 3/10 with ADLs and light activity most days of the week Baseline: 4/10-6/10 Goal status: INITIAL   3.  Pt will be able to return to gym routine, modified if needed for safety and form Baseline: has stopped altogether  Goal status: INITIAL   4.  Pt will be able to squat and lift items (up to 25 lbs) from the floor without increased back pain  Baseline: fearful, pain in back  Goal status: INITIAL       PLAN: PT FREQUENCY: 1x/week PER REFERRAL: DO NOT SCHEDULE MORE THAN 6 VISITS. Return to [facility] .      PT DURATION: 6 weeks   PLANNED INTERVENTIONS: Therapeutic exercises, Therapeutic activity, Neuromuscular re-education, Balance training, Gait training, Patient/Family education, Self Care, Joint mobilization, Joint manipulation, Dry Needling, Spinal manipulation, Spinal mobilization, Cryotherapy, Moist heat, Manual therapy, and Re-evaluation   PLAN FOR NEXT SESSION: HEP and progress . Loading when ready. Consider T-L manipulation.     Haylie Mccutcheon, PT 10/06/2022, 9:29 AM    Raeford Razor, PT 10/06/22 9:29 AM Phone: [phone] Fax: [phone]    PHYSICAL THERAPY DISCHARGE SUMMARY  Visits from Start of Care: 3  Current functional level related to goals / functional outcomes: Unknown    Remaining deficits: unknown   Education / Equipment: HEP   Patient agrees to discharge. Patient goals were not met. Patient is being discharged due to not returning since the last visit.  Raeford Razor, PT 12/06/22 11:26 AM Phone: [phone] Fax: [phone]

## 2022-10-06 ENCOUNTER — Ambulatory Visit: Payer: Medicaid Other | Admitting: Physical Therapy

## 2022-10-06 ENCOUNTER — Encounter: Payer: Self-pay | Admitting: Physical Therapy

## 2022-10-06 DIAGNOSIS — M5459 Other low back pain: Secondary | ICD-10-CM

## 2022-10-06 DIAGNOSIS — M542 Cervicalgia: Secondary | ICD-10-CM

## 2022-10-06 DIAGNOSIS — M25552 Pain in left hip: Secondary | ICD-10-CM

## 2022-10-07 NOTE — Therapy (Deleted)
OUTPATIENT PHYSICAL THERAPY TREATMENT NOTE   Patient Name: Carolyn Harrington MRN: [id number] DOB:1991/03/16, 31 y.o., female Today's Date: 10/07/2022  PCP: Dr. Mila Palmer REFERRING PROVIDER: Janeece Riggers, FNP   END OF SESSION:      Past Medical History:  Diagnosis Date   Headache(784.0)    Late prenatal care    Past Surgical History:  Procedure Laterality Date   TONSILLECTOMY     Patient Active Problem List   Diagnosis Date Noted   Indication for care in labor and delivery, antepartum 04/15/2018   Overweight(278.02) 04/05/2012   Contraception management 04/05/2012   Pelvic relaxation 04/05/2012   Constipation 04/05/2012   Migraines 11/25/2011   SVD (spontaneous vaginal delivery) 11/25/2011    REFERRING DIAG: M54.2 (ICD-10-CM) - Cervicalgia   THERAPY DIAG:  No diagnosis found.  Rationale for Evaluation and Treatment Rehabilitation  PERTINENT HISTORY: see above   PRECAUTIONS: none   SUBJECTIVE:                                                                                                                                                                                      SUBJECTIVE STATEMENT:  Neck is better.  I was able to manipulate my back last night and get rid of the soreness.  I still need to strengthen though.     PAIN:  Are you having pain? Yes: NPRS scale: 3/10 Pain location: pelvis, back  Pain description: tight , fullness Aggravating factors: sitting long time  Relieving factors: gentle movement  Yes: NPRS scale: 0/10 Pain location: neck L , jaw  Pain description: tight  Aggravating factors: laying wrong  Relieving factors: stretching it  OBJECTIVE: (objective measures completed at initial evaluation unless otherwise dated)  DIAGNOSTIC FINDINGS:  XR [place] imaging Cervical loss of lordosis Normal Lumbar    PATIENT SURVEYS:  NT , given wrong body part survey     COGNITION: Overall cognitive status: Within functional limits for  tasks assessed     SENSATION: L ant lateral thigh numb    POSTURE: increased lumbar lordosis   PALPATION: Pain with palpation in prone along L mid thoracic to L5 and gluteals     CERVICAL ROM:  WNL with no pain    UPPER EXTREMITY ROM:             WNL no pain    UPPER EXTREMITY MMT:   MMT Right eval Left eval  Shoulder flexion 4 4  Shoulder extension      Shoulder abduction 4 4  Shoulder adduction      Shoulder extension      Shoulder internal rotation 5 5  Shoulder external rotation 5  5  Middle trapezius      Lower trapezius      Elbow flexion      Elbow extension      Grip strength       (Blank rows = not tested)   CERVICAL SPECIAL TESTS:  Spurling's test: NT   Lumbar AROM Flexion : pain, 25 % limited Extension: WFL , central discomfort  Rt SB pain along L side  Lt. SB WNL  Rotation WNL    LE MMT: Hip flexion 4/5 Hip abd Rt 3+/5, Lt 5/5 Hip extension Rt 4/5, Lt. 5/5 Knees 5/5 bilateral , ankle DF 5/5    FUNCTIONAL TESTS:  NT      TODAY'S TREATMENT:  Eval of neck and back HEP POC established  Postural imbalance Role of core   [facility] Adult PT Treatment:                                                DATE: 10/10/22 Therapeutic Exercise: *** Manual Therapy: *** Neuromuscular re-ed: *** Therapeutic Activity: *** Modalities: *** Self Care: ***  [facility] Adult PT Treatment:                                                DATE: 10/06/22 Therapeutic Exercise: Elliptical level 5 ramp, Level 8 resistance for 5 min  Squat KB 15 lbs  RDL KB 15 lbs  Stagger stance RDL 15 lb each side Single leg RDL at counter top bilateral UEs light UE support  No UE support single leg RDL with 15 lbs  Wide stance lateral lunge slow, no wgt then 15 lbs KB Supine rotation, self mobilization  Rt hip ER (knee press/knee pull)  Supine 90/90 hold added toe tap x 10 Supine 9090 knee etxension  Bridging (articulation) x 10    [facility] Adult PT Treatment:                                                 DATE: 09/30/22 Therapeutic Exercise: LTR knees together x 10  knees apart x 10  Knee to chest  x 2 each side  Cat/camel  x 5 Bird dog x 5  Supine a/p tilt over soft ball with exhale/PPT Supine march and single leg bent knee fall out x 10 each (ball under pelvis)  Sit to stand 15 lbs KB x 10 Squat 15 lb KB, less knee pain with slight hip ER Dead lift 15 lbs KB x 15 , min cues for reducing lumbar hyperext.  Piriformis stretch 30 sec x 2  Manual Therapy: IASTM and manual soft tissue mobilization to bilateral SIJ, glutes    PATIENT EDUCATION:  Education details: see above  Person educated: Patient Education method: Explanation, Demonstration, Verbal cues, and Handouts Education comprehension: verbalized understanding and returned demonstration     HOME EXERCISE PROGRAM: Access Code: [email or web address] URL: [email or web address] Date: 09/22/2022 Prepared by: Raeford Razor  ASSESSMENT:   CLINICAL IMPRESSION: Patient able to perform strength and stability exercises with good form and cue correction.  Pt had an increased pain in Rt post hip and pelvis, 5/10-6/10,  began to burn a bit. She was able to manipulate her body to reduce the symptoms.  Given HEP to address stability in standing.  She had no pain as she walked out of the clinic.    OBJECTIVE IMPAIRMENTS decreased mobility, decreased ROM, decreased strength, increased fascial restrictions, postural dysfunction, and pain.    ACTIVITY LIMITATIONS carrying, lifting, bending, sitting, standing, squatting, and locomotion level   PARTICIPATION LIMITATIONS: cleaning, interpersonal relationship, community activity, and recreation, fitness    PERSONAL FACTORS Time since onset of injury/illness/exacerbation are also affecting patient's functional outcome.    REHAB POTENTIAL: Excellent   CLINICAL DECISION MAKING: Stable/uncomplicated   EVALUATION COMPLEXITY: Low     GOALS:   LONG TERM GOALS: Target  date: 11/03/2022   Pt will be I with final HEP  Baseline: given on evaluation  Goal status: INITIAL   2.  Pt will be able to report pain in low back, hips as < 3/10 with ADLs and light activity most days of the week Baseline: 4/10-6/10 Goal status: INITIAL   3.  Pt will be able to return to gym routine, modified if needed for safety and form Baseline: has stopped altogether  Goal status: INITIAL   4.  Pt will be able to squat and lift items (up to 25 lbs) from the floor without increased back pain  Baseline: fearful, pain in back  Goal status: INITIAL       PLAN: PT FREQUENCY: 1x/week PER REFERRAL: DO NOT SCHEDULE MORE THAN 6 VISITS. Return to [facility] .      PT DURATION: 6 weeks   PLANNED INTERVENTIONS: Therapeutic exercises, Therapeutic activity, Neuromuscular re-education, Balance training, Gait training, Patient/Family education, Self Care, Joint mobilization, Joint manipulation, Dry Needling, Spinal manipulation, Spinal mobilization, Cryotherapy, Moist heat, Manual therapy, and Re-evaluation   PLAN FOR NEXT SESSION: HEP and progress . Loading when ready. Consider T-L manipulation.     Breean Nannini, PT 10/07/2022, 1:03 PM    Karie Mainland, PT 10/07/22 1:03 PM Phone: [phone] Fax: [phone]

## 2022-10-10 ENCOUNTER — Ambulatory Visit: Payer: Medicaid Other | Admitting: Physical Therapy

## 2022-10-17 ENCOUNTER — Ambulatory Visit: Payer: Medicaid Other | Admitting: Physical Therapy

## 2022-10-24 ENCOUNTER — Ambulatory Visit: Payer: Medicaid Other | Admitting: Physical Therapy

## 2022-10-31 ENCOUNTER — Encounter: Payer: Medicaid Other | Admitting: Physical Therapy

## 2022-11-07 ENCOUNTER — Encounter: Payer: Medicaid Other | Admitting: Physical Therapy

## 2024-02-09 ENCOUNTER — Other Ambulatory Visit: Payer: Self-pay

## 2024-02-09 ENCOUNTER — Emergency Department (HOSPITAL_COMMUNITY)
Admission: EM | Admit: 2024-02-09 | Discharge: 2024-02-09 | Disposition: A | Discharge status: Home / Self Care | Payer: Medicaid Other | Attending: Emergency Medicine | Admitting: Emergency Medicine

## 2024-02-09 ENCOUNTER — Encounter (HOSPITAL_COMMUNITY): Payer: Self-pay

## 2024-02-09 ENCOUNTER — Emergency Department (HOSPITAL_COMMUNITY): Payer: Medicaid Other

## 2024-02-09 DIAGNOSIS — R519 Headache, unspecified: Secondary | ICD-10-CM | POA: Diagnosis present

## 2024-02-09 DIAGNOSIS — G43809 Other migraine, not intractable, without status migrainosus: Secondary | ICD-10-CM | POA: Diagnosis not present

## 2024-02-09 DIAGNOSIS — R0789 Other chest pain: Secondary | ICD-10-CM | POA: Diagnosis not present

## 2024-02-09 DIAGNOSIS — R059 Cough, unspecified: Secondary | ICD-10-CM | POA: Diagnosis not present

## 2024-02-09 LAB — BASIC METABOLIC PANEL
Anion gap: 11 (ref 5–15)
BUN: 6 mg/dL (ref 6–20)
CO2: 21 mmol/L — ABNORMAL LOW (ref 22–32)
Calcium: 9.1 mg/dL (ref 8.9–10.3)
Chloride: 107 mmol/L (ref 98–111)
Creatinine, Ser: 0.8 mg/dL (ref 0.44–1.00)
GFR, Estimated: >60 mL/min (ref >60)
Glucose, Bld: 91 mg/dL (ref 70–99)
Potassium: 4.1 mmol/L (ref 3.5–5.1)
Sodium: 139 mmol/L (ref 135–145)

## 2024-02-09 LAB — CBC
HCT: 39.1 % (ref 36.0–46.0)
Hemoglobin: 12.9 g/dL (ref 12.0–15.0)
MCH: 29.9 pg (ref 26.0–34.0)
MCHC: 33 g/dL (ref 30.0–36.0)
MCV: 90.7 fL (ref 80.0–100.0)
Platelets: 362 10*3/uL (ref 150–400)
RBC: 4.31 MIL/uL (ref 3.87–5.11)
RDW: 12.6 % (ref 11.5–15.5)
WBC: 5.4 10*3/uL (ref 4.0–10.5)
nRBC: 0 % (ref 0.0–0.2)

## 2024-02-09 LAB — RESP PANEL BY RT-PCR (RSV, FLU A&B, COVID)  RVPGX2
Influenza A by PCR: NEGATIVE
Influenza B by PCR: NEGATIVE
Resp Syncytial Virus by PCR: NEGATIVE
SARS Coronavirus 2 by RT PCR: NEGATIVE

## 2024-02-09 LAB — HCG, SERUM, QUALITATIVE: Preg, Serum: NEGATIVE

## 2024-02-09 LAB — TROPONIN I (HIGH SENSITIVITY)
Troponin I (High Sensitivity): <2 ng/L (ref <18)
Troponin I (High Sensitivity): <2 ng/L (ref <18)

## 2024-02-09 MED ORDER — DEXAMETHASONE SODIUM PHOSPHATE 4 MG/ML IJ SOLN
4.0000 mg | Freq: Once | INTRAMUSCULAR | Status: AC
  Administered 2024-02-09: 4 mg via INTRAVENOUS
  Filled 2024-02-09: qty 1

## 2024-02-09 MED ORDER — METOCLOPRAMIDE HCL 5 MG/ML IJ SOLN
10.0000 mg | Freq: Once | INTRAMUSCULAR | Status: AC
  Administered 2024-02-09: 10 mg via INTRAVENOUS
  Filled 2024-02-09: qty 2

## 2024-02-09 MED ORDER — SODIUM CHLORIDE 0.9 % IV BOLUS
1000.0000 mL | Freq: Once | INTRAVENOUS | Status: AC
  Administered 2024-02-09: 1000 mL via INTRAVENOUS

## 2024-02-09 MED ORDER — DIPHENHYDRAMINE HCL 50 MG/ML IJ SOLN
12.5000 mg | Freq: Once | INTRAMUSCULAR | Status: AC
  Administered 2024-02-09: 12.5 mg via INTRAVENOUS
  Filled 2024-02-09: qty 1

## 2024-02-09 MED ORDER — KETOROLAC TROMETHAMINE 15 MG/ML IJ SOLN
15.0000 mg | Freq: Once | INTRAMUSCULAR | Status: AC
  Administered 2024-02-09: 15 mg via INTRAVENOUS
  Filled 2024-02-09: qty 1

## 2024-02-09 MED ORDER — LIDOCAINE 5 % EX PTCH
2.0000 | MEDICATED_PATCH | CUTANEOUS | Status: DC
  Administered 2024-02-09: 2 via TRANSDERMAL
  Filled 2024-02-09: qty 2

## 2024-02-09 NOTE — ED Provider Notes (Signed)
 [place] EMERGENCY DEPARTMENT AT [facility] Provider Note   CSN: [id number] Arrival date & time: 02/09/24  6578     History  Chief Complaint  Patient presents with   Chest Pain    Carolyn Harrington is a 33 y.o. female with a history of migraines who presents the ED today for chest pain and headache.  Patient reports intermittent left-sided chest pain for the past week that feels like a stabbing sensation.  States that stretching helps improve the sensation.  Pain is not worse with deep breaths or movement.  Additionally she endorses a left-sided headache that radiates down her face to her neck and left arm.  Is taking BC powders without improvement. She reports history of migraines as a child and states that this does not feel worse than those.  States that symptoms started on the last day of her menstrual cycle.  Endorses associated nausea due to the pain but denies vomiting, shortness of breath, or fevers.  No weakness, loss of sensation, or decreased range of motion.  No additional complaints or concerns at this time.    Home Medications Prior to Admission medications   Medication Sig Start Date End Date Taking? Authorizing Provider  Aspirin-Salicylamide-Caffeine (ARTHRITIS STRENGTH BC POWDER PO) Take 1 packet by mouth 2 (two) times daily as needed (pain, headache).   Yes [provider]      Allergies    Patient has no known allergies.    Review of Systems   Review of Systems  Cardiovascular:  Positive for chest pain.  All other systems reviewed and are negative.   Physical Exam Updated Vital Signs BP 124/76   Pulse 84   Temp 98.4 F (36.9 C) (Oral)   Resp 18   Ht 5\' 2"  (1.575 m)   Wt 77.1 kg   LMP 02/01/2024 (Exact Date)   SpO2 100%   BMI 31.09 kg/m  Physical Exam Vitals and nursing note reviewed.  Constitutional:      Appearance: Normal appearance.  HENT:     Head: Normocephalic and atraumatic.     Mouth/Throat:     Mouth: Mucous  membranes are moist.  Eyes:     Conjunctiva/sclera: Conjunctivae normal.     Pupils: Pupils are equal, round, and reactive to light.  Cardiovascular:     Rate and Rhythm: Normal rate and regular rhythm.     Pulses: Normal pulses.     Heart sounds: Normal heart sounds.  Pulmonary:     Effort: Pulmonary effort is normal.     Breath sounds: Normal breath sounds.  Abdominal:     Palpations: Abdomen is soft.     Tenderness: There is no abdominal tenderness.  Musculoskeletal:        General: No tenderness. Normal range of motion.     Cervical back: Normal range of motion. No rigidity.     Comments: Range of motion of neck and upper extremities intact.  No tenderness to palpation of the left upper extremity.  Skin:    General: Skin is warm and dry.     Findings: No rash.  Neurological:     General: No focal deficit present.     Mental Status: She is alert.     Sensory: No sensory deficit.     Motor: No weakness.     Comments: Cranial nerves V and VII intact.  Range of motion, strength, and sensation of upper and lower extremities intact bilaterally.  Psychiatric:  Mood and Affect: Mood normal.        Behavior: Behavior normal.    ED Results / Procedures / Treatments   Labs (all labs ordered are listed, but only abnormal results are displayed) Labs Reviewed  BASIC METABOLIC PANEL - Abnormal; Notable for the following components:      Result Value   CO2 21 (*)    All other components within normal limits  RESP PANEL BY RT-PCR (RSV, FLU A&B, COVID)  RVPGX2  CBC  HCG, SERUM, QUALITATIVE  TROPONIN I (HIGH SENSITIVITY)  TROPONIN I (HIGH SENSITIVITY)    EKG EKG Interpretation Date/Time:  Friday February 09 2024 08:50:55 EST Ventricular Rate:  82 PR Interval:  158 QRS Duration:  86 QT Interval:  352 QTC Calculation: 411 R Axis:   63  Text Interpretation: Normal sinus rhythm Right atrial enlargement Borderline ECG No previous ECGs available Confirmed by Gwyneth Sprout ([id number]) on 02/09/2024 10:20:25 AM  Radiology DG Chest 2 View Result Date: 02/09/2024 CLINICAL DATA:  Cough. EXAM: CHEST - 2 VIEW COMPARISON:  None Available. FINDINGS: Bilateral lung fields are clear. Bilateral costophrenic angles are clear. Normal cardio-mediastinal silhouette. No acute osseous abnormalities. The soft tissues are within normal limits. IMPRESSION: No active cardiopulmonary disease. Electronically Signed   By: Jules Schick M.D.   On: 02/09/2024 10:14    Procedures Procedures: not indicated.    Medications Ordered in ED Medications  lidocaine (LIDODERM) 5 % 2 patch (2 patches Transdermal Patch Applied 02/09/24 1210)  sodium chloride 0.9 % bolus 1,000 mL (0 mLs Intravenous Stopped 02/09/24 1210)  metoCLOPramide (REGLAN) injection 10 mg (10 mg Intravenous Given 02/09/24 1113)  diphenhydrAMINE (BENADRYL) injection 12.5 mg (12.5 mg Intravenous Given 02/09/24 1113)  dexamethasone (DECADRON) injection 4 mg (4 mg Intravenous Given 02/09/24 1113)  ketorolac (TORADOL) 15 MG/ML injection 15 mg (15 mg Intravenous Given 02/09/24 1121)    ED Course/ Medical Decision Making/ A&P                                 Medical Decision Making Amount and/or Complexity of Data Reviewed Labs: ordered. Radiology: ordered.   This patient presents to the ED for concern of chest pain, this involves an extensive number of treatment options, and is a complaint that carries with it a high risk of complications and morbidity.   Differential diagnosis includes: ACS, PE, costochondritis, pleurisy, muscle strain, tension headache, cluster headache, migraine, atypical migraine, electrolyte derangement, dehydration, etc. PERC negative - low suspicion for pulmonary embolism   Comorbidities  See HPI above   Additional History  Additional history obtained from prior records   Cardiac Monitoring / EKG  The patient was maintained on a cardiac monitor.  I personally viewed and interpreted the  cardiac monitored which showed: NSR with a heart rate of 82 bpm.   Lab Tests  I ordered and personally interpreted labs.  The pertinent results include:   Troponin less than 2 Negative pregnancy test BMP and CBC unremarkable Negative respiratory panel   Imaging Studies  I ordered imaging studies including CXR  I independently visualized and interpreted imaging which showed: no acute cardiopulmonary findings. I agree with the radiologist interpretation   Problem List / ED Course / Critical Interventions / Medication Management  Left-sided headache as well as left-sided chest pain for the past week.  Patient has a history of headaches and says this is not the worst that she has ever  had.  Used to see neurology for headaches in the past but does not anymore.  Endorses associated photophobia and phonophobia.  No head injuries prior to onset of symptoms.  Has been taking BC powders without improvement.  Additionally endorses intermittent stabbing-like chest pains of the left side of her chest for the past week.  Pain improves with stretching.  Not worse with deep inspirations.  No associated shortness of breath or tenderness to palpation of chest. I ordered medications including: Toradol, Decadron, Reglan, Benadryl, and normal saline for headache Reevaluation of the patient after these medicines showed that the patient resolved.  Chest pain improved with the medications as well.  Lidocaine patch given prior to discharge for additional relief. I have reviewed the patients home medicines and have made adjustments as needed   Social Determinants of Health  Access to healthcare   Test / Admission - Considered  Discussed findings with patient.  All questions were answered. She is hemodynamically stable and safe for discharge home. Return precautions given.       Final Clinical Impression(s) / ED Diagnoses Final diagnoses:  Atypical chest pain  Other migraine without status  migrainosus, not intractable    Rx / DC Orders ED Discharge Orders     None         Maxwell Marion, PA-C 02/09/24 1212    Gwyneth Sprout, MD 02/11/24 1109

## 2024-02-09 NOTE — ED Triage Notes (Signed)
 Pt c/o HA, jaw pain, chest pain, and pain radiating into bilateral upper extremities for past week. Pt has had nausea, denies vomiting or shortness of breath.

## 2024-02-09 NOTE — Discharge Instructions (Addendum)
 As discussed, your labs and imaging are reassuring. Alternate between Ibuprofen 600 mg and Tylenol 650 mg as needed for chest pains. You most likely have costochondritis, or inflammation of the chest wall. These medications will help with your symptoms as well. You can get lidocaine patches OTC if they help improve the pain as well.  Follow up with your PCP in the next week for reevaluation of symptoms.   Get help right away if: You have a hard time breathing. You have very bad chest pain that does not get better with medicines, heat, or ice.

## 2024-02-11 ENCOUNTER — Emergency Department (HOSPITAL_COMMUNITY)
Admission: EM | Admit: 2024-02-11 | Discharge: 2024-02-11 | Disposition: A | Discharge status: Home / Self Care | Attending: Emergency Medicine | Admitting: Emergency Medicine

## 2024-02-11 ENCOUNTER — Encounter (HOSPITAL_COMMUNITY): Payer: Self-pay

## 2024-02-11 ENCOUNTER — Other Ambulatory Visit: Payer: Self-pay

## 2024-02-11 DIAGNOSIS — R519 Headache, unspecified: Secondary | ICD-10-CM | POA: Diagnosis present

## 2024-02-11 DIAGNOSIS — G8929 Other chronic pain: Secondary | ICD-10-CM

## 2024-02-11 MED ORDER — DIPHENHYDRAMINE HCL 50 MG/ML IJ SOLN
12.5000 mg | Freq: Once | INTRAMUSCULAR | Status: AC
  Administered 2024-02-11: 12.5 mg via INTRAVENOUS
  Filled 2024-02-11: qty 1

## 2024-02-11 MED ORDER — DEXAMETHASONE SODIUM PHOSPHATE 4 MG/ML IJ SOLN
4.0000 mg | Freq: Once | INTRAMUSCULAR | Status: AC
  Administered 2024-02-11: 4 mg via INTRAVENOUS
  Filled 2024-02-11: qty 1

## 2024-02-11 MED ORDER — KETOROLAC TROMETHAMINE 15 MG/ML IJ SOLN
15.0000 mg | Freq: Once | INTRAMUSCULAR | Status: AC
  Administered 2024-02-11: 15 mg via INTRAVENOUS
  Filled 2024-02-11: qty 1

## 2024-02-11 MED ORDER — SODIUM CHLORIDE 0.9 % IV BOLUS
1000.0000 mL | Freq: Once | INTRAVENOUS | Status: AC
  Administered 2024-02-11: 1000 mL via INTRAVENOUS

## 2024-02-11 MED ORDER — METOCLOPRAMIDE HCL 5 MG/ML IJ SOLN
10.0000 mg | Freq: Once | INTRAMUSCULAR | Status: AC
  Administered 2024-02-11: 10 mg via INTRAVENOUS
  Filled 2024-02-11: qty 2

## 2024-02-11 NOTE — ED Provider Notes (Signed)
 [place] EMERGENCY DEPARTMENT AT [facility] Provider Note   CSN: [id number] Arrival date & time: 02/11/24  1036     History  Chief Complaint  Patient presents with   Headache    Carolyn Harrington is a 33 y.o. female who presents with right-sided headache that has been ongoing for the past month.  Patient was evaluated recently for similar complaints.  Workup at that time did not show any acute findings.  She received a migraine cocktail which alleviated at that time her left-sided headache.  However pain has persisted on the right.  She states symptoms are persistent.  No improvement with BS powder.  Headache is persistent, it was not sudden, maximal in onset or worst of life time.  Denies any blurry vision, dizziness, difficulty ambulating or weakness.  She is just requesting medication for the migraine and follow-up.   Headache  Past Medical History:  Diagnosis Date   Headache(784.0)    Late prenatal care        Home Medications Prior to Admission medications   Medication Sig Start Date End Date Taking? Authorizing Provider  Aspirin-Salicylamide-Caffeine (ARTHRITIS STRENGTH BC POWDER PO) Take 1 packet by mouth 2 (two) times daily as needed (pain, headache).    [provider]      Allergies    Patient has no known allergies.    Review of Systems   Review of Systems  Neurological:  Positive for headaches.    Physical Exam Updated Vital Signs BP 132/76 (BP Location: Right Arm)   Pulse 73   Temp 98.4 F (36.9 C)   Resp 15   Ht 5\' 2"  (1.575 m)   Wt 77.1 kg   LMP 02/01/2024 (Exact Date)   SpO2 100%   BMI 31.09 kg/m  Physical Exam Vitals and nursing note reviewed.  Constitutional:      General: She is not in acute distress.    Appearance: She is well-developed.  HENT:     Head: Normocephalic and atraumatic.  Eyes:     Conjunctiva/sclera: Conjunctivae normal.  Cardiovascular:     Rate and Rhythm: Normal rate and regular rhythm.     Heart  sounds: No murmur heard. Pulmonary:     Effort: Pulmonary effort is normal. No respiratory distress.     Breath sounds: Normal breath sounds. No wheezing or rales.  Abdominal:     Palpations: Abdomen is soft.     Tenderness: There is no abdominal tenderness.  Musculoskeletal:        General: No swelling.     Cervical back: Neck supple.  Skin:    General: Skin is warm and dry.     Capillary Refill: Capillary refill takes less than 2 seconds.  Neurological:     Mental Status: She is alert.     Comments: Patient is alert and oriented. There is no abnormal phonation. Symmetric smile without facial droop. Moves all extremities spontaneously. 5/5 strength in upper and lower extremities. . No sensation deficit. There is no nystagmus. EOMI, PERRL. Coordination intact with finger to nose and normal ambulation.    Psychiatric:        Mood and Affect: Mood normal.     ED Results / Procedures / Treatments   Labs (all labs ordered are listed, but only abnormal results are displayed) Labs Reviewed - No data to display  EKG None  Radiology No results found.  Procedures Procedures    Medications Ordered in ED Medications  sodium chloride 0.9 %  bolus 1,000 mL (0 mLs Intravenous Stopped 02/11/24 1249)  ketorolac (TORADOL) 15 MG/ML injection 15 mg (15 mg Intravenous Given 02/11/24 1147)  metoCLOPramide (REGLAN) injection 10 mg (10 mg Intravenous Given 02/11/24 1149)  diphenhydrAMINE (BENADRYL) injection 12.5 mg (12.5 mg Intravenous Given 02/11/24 1149)  dexamethasone (DECADRON) injection 4 mg (4 mg Intravenous Given 02/11/24 1146)    ED Course/ Medical Decision Making/ A&P                                 Medical Decision Making Risk Prescription drug management.   This patient presents to the ED with chief complaint(s) of headache.  The complaint involves an extensive differential diagnosis and also carries with it a high risk of complications and morbidity.  Pertinent past medical history  as listed in HPI  The differential diagnosis includes  Migraine/tension/cluster headache, meningitis, intracranial hemorrhage, CVA, TIA, vertebral dissection, URI, TBI, giant cell  Additional history obtained:  Records reviewed previous admission documents and Care Everywhere/External Records  Initial Assessment:   Patient presents with complaints of right-sided persistent headache.  Headache was not sudden or maximal in onset.  It is not worse of lifetime.  On exam she has no neurodeficits.  No suspicion for CVA, TIA or dissection.  She has no meningismus.  She is able to ambulate.  No cough or congestion to suggest URI.  No injury or trauma to suggest TBI.  Clinical exam and history not consistent with giant cell arteritis.  Overall most consistent with migraine/tension like headache.  Will start with migraine cocktail.  Independent ECG interpretation:  none  Independent labs interpretation:  The following labs were independently interpreted:  Reviewed recent ED visit lab work without acute findings  Independent visualization and interpretation of imaging: none  Treatment and Reassessment: Patient given migraine cocktail following first assessment consisting of fluid bolus, Decadron 4 mg, Benadryl 12.5 mg, Toradol 15 mg, Reglan 10 mg  Consultations obtained:   none  Disposition:   Patient will be discharged home.  She has an upcoming PCP appointment to establish care.  Will additionally provide neuro follow-up.  The patient has been appropriately medically screened and/or stabilized in the ED. I have low suspicion for any other emergent medical condition which would require further screening, evaluation or treatment in the ED or require inpatient management. At time of discharge the patient is hemodynamically stable and in no acute distress. I have discussed work-up results and diagnosis with patient and answered all questions. Patient is agreeable with discharge plan. We discussed  strict return precautions for returning to the emergency department and they verbalized understanding.     Social Determinants of Health:   none  This note was dictated with voice recognition software.  Despite best efforts at proofreading, errors may have occurred which can change the documentation meaning.           Final Clinical Impression(s) / ED Diagnoses Final diagnoses:  Chronic nonintractable headache, unspecified headache type    Rx / DC Orders ED Discharge Orders     None         Halford Decamp, PA-C 02/11/24 1309    Pricilla Loveless, MD 02/13/24 [phone]

## 2024-02-11 NOTE — Discharge Instructions (Signed)
 You were evaluated in the emergency room for headache.  You were given a migraine cocktail and provided contact information for neurologist.  Please call make an appointment at your earliest convenience.  Please keep your appointment with your primary care provider.  If you experience any new or worsening symptoms including loss of vision, difficulty walking, persistent vomiting please return to the emergency room.

## 2024-02-11 NOTE — ED Triage Notes (Signed)
 Pt c.o right sided headache for the past 4 days, pt was seen here for the same but pain returned.
